# Patient Record
Sex: Male | Born: 1965 | Race: White | Hispanic: No | Marital: Single | Smoking: Never smoker
Health system: Southern US, Community
[De-identification: ages and names within clinical notes are randomized; demographics above are authoritative.]

## PROBLEM LIST (undated history)

## (undated) DIAGNOSIS — B20 Human immunodeficiency virus [HIV] disease: Principal | ICD-10-CM

## (undated) DIAGNOSIS — Z972 Presence of dental prosthetic device (complete) (partial): Secondary | ICD-10-CM

## (undated) DIAGNOSIS — F419 Anxiety disorder, unspecified: Secondary | ICD-10-CM

## (undated) DIAGNOSIS — F329 Major depressive disorder, single episode, unspecified: Secondary | ICD-10-CM

## (undated) DIAGNOSIS — Z973 Presence of spectacles and contact lenses: Secondary | ICD-10-CM

## (undated) DIAGNOSIS — T7840XA Allergy, unspecified, initial encounter: Secondary | ICD-10-CM

## (undated) HISTORY — DX: Presence of spectacles and contact lenses: Z97.3

## (undated) HISTORY — DX: Major depressive disorder, single episode, unspecified: F32.9

## (undated) HISTORY — DX: Anxiety disorder, unspecified: F41.9

## (undated) HISTORY — DX: Presence of dental prosthetic device (complete) (partial): Z97.2

## (undated) HISTORY — DX: Human immunodeficiency virus (HIV) disease: B20

## (undated) HISTORY — DX: Allergy, unspecified, initial encounter: T78.40XA

## (undated) HISTORY — PX: WISDOM TOOTH EXTRACTION: SHX21

---

## 2004-08-22 DIAGNOSIS — B20 Human immunodeficiency virus [HIV] disease: Secondary | ICD-10-CM

## 2004-08-22 HISTORY — DX: Human immunodeficiency virus (HIV) disease: B20

## 2006-08-22 DIAGNOSIS — F32A Depression, unspecified: Secondary | ICD-10-CM

## 2006-08-22 HISTORY — DX: Depression, unspecified: F32.A

## 2013-05-14 ENCOUNTER — Ambulatory Visit (INDEPENDENT_AMBULATORY_CARE_PROVIDER_SITE_OTHER): Payer: BC Managed Care – PPO | Admitting: Medical

## 2013-05-14 ENCOUNTER — Telehealth: Payer: Self-pay | Admitting: Medical

## 2013-05-14 ENCOUNTER — Encounter: Payer: Self-pay | Admitting: Medical

## 2013-05-14 VITALS — BP 110/82 | HR 60 | Temp 98.1°F | Resp 16 | Ht 68.0 in | Wt 254.0 lb

## 2013-05-14 DIAGNOSIS — B20 Human immunodeficiency virus [HIV] disease: Secondary | ICD-10-CM

## 2013-05-14 DIAGNOSIS — Z23 Encounter for immunization: Secondary | ICD-10-CM

## 2013-05-14 DIAGNOSIS — F329 Major depressive disorder, single episode, unspecified: Secondary | ICD-10-CM

## 2013-05-14 DIAGNOSIS — F3289 Other specified depressive episodes: Secondary | ICD-10-CM

## 2013-05-14 DIAGNOSIS — F32A Depression, unspecified: Secondary | ICD-10-CM

## 2013-05-14 MED ORDER — EMTRICITABINE-TENOFOVIR DF 200-300 MG PO TABS: 1.0000 | ORAL_TABLET | Freq: Every day | ORAL | Status: DC

## 2013-05-14 MED ORDER — RALTEGRAVIR POTASSIUM 400 MG PO TABS: 400.0000 mg | ORAL_TABLET | Freq: Two times a day (BID) | ORAL | Status: DC

## 2013-05-14 MED ORDER — FLUOXETINE HCL 20 MG PO TABS: 20.0000 mg | ORAL_TABLET | Freq: Every day | ORAL | Status: DC

## 2013-05-14 NOTE — Telephone Encounter (Signed)
I need help on this one.  Any suggestions?

## 2013-05-14 NOTE — Patient Instructions (Signed)
RESOURCES in [place], [place]  If you are experiencing a mental health crisis or an emergency, please call 911 or go to the nearest emergency department.  [facility]   [phone] [facility]  [phone] [facility]   [phone]  Suicide Hotline 1-800-Suicide (1-800-784-2433)  National Suicide Prevention Lifeline 1-800-273-TALK  (1-800-273-8255)  Domestic Violence, Rape/Crisis - [facility] 336-273-7273  The National Domestic Violence Hotline 1-800-799-SAFE (1-800-799-7233)  To report Child or Elder Abuse, please call: [place] [employer]  [phone] [place] [employer]  [phone]  LGBT Youth Crisis Line 1-866-488-7386  Teen Crisis line 336-387-6161 or 1-877-332-7333     Psychiatry and Counseling services   Dr. Parish McKinney, psychiatry [phone] office [email or web address] [address], [address], [place], [place] [address] Dr. Parish McKinney Meredith Baker, NP Micheal Lefaive, NP  Anxiety, Depression, ADHD, OCD, Eating Disorders, Bipolar, other   [facility] [phone] office [email or web address] [address] [place], [place] [address]  Dr. Masoud Hejazi, psychiatry services  Dr. Todd Lewis  Depression, Anxiety, Substance Abuse, Couples Issues, Adolescent Issues Robyn Bridges, NP Depression, Anxiety, ADHD, Women's issues, Bipolar Disorder, Substance   Abuse Kelly Virgil, NP Depression, Anxiety, Aging, ADHD, Bipolar Disorder, Substance Abuse Ann Bailey, NP  Mood disturbances, ADHD, children, adolescents, adults Deb Connery, Therapist Sexual Addiction, Bipolar, Depression, Anxiety, Substance Addiction Claudia McCoy, Therapist Grief and Loss, Anxiety, Depression, Bipolar, Medical Challenges, Life    Transitions Linda Hileman, Therapist Substance Abuse, Relationships, Clergy Families, Anger and Stress Management, Postpartum Depression,  Pre-Marital Counseling Patricia Goral, Therapist [place], Anxiety, Depression, ADHD, Adjustment Disorder, PTSD, Grief and Loss, Divorce, Adoption Concerns   [facility] [phone] office [email or web address] [address], [place], [place] [address]  Erik Nelson, MA, clinical psychologist  Cognitive-Behavior Therapy; Mood Disorders; Anxiety Disorders; adult and child ADHD; Family Therapy; Stress Management; personal growth, and Marital Therapy.    Dennis McKnight Ph.D., clinical psychologist Cognitive-Behavior Therapy; Mood Disorders; Anxiety Disorders; Stress     Management   Elaine Talbert Ph.D., clinical psychologist [phone] office [address] [place], [place] [address] Cognitive Behavior Therapy, Depression, Bipolar, Anxiety, Grief and Loss    [facility] [phone] office [address] [place], [place] [address] Crisis services, Family support, in home therapy, treatment for Anxiety, PTSD, Sexual Assault, Substance Abuse, Financial/Credit Counseling, Variety of other services    [email or web address] [phone] office [address], [place], [place] [address]  Scott Yount, Ph.D., LPC Family, Couples, Anxiety, Depression, ADHD, Abuse, Anger Management Catharine Dowda, M.Ed., LPC Couples, Sexual orientation, Domestic violence, Child Abuse, Major Life Change,  Depression Traci Collins, LPC Marriage counseling, Women's Issues, Depression, Intimacy, Career Issues Katherine Glenn, Ph.D.,  LPC PTSD, Addictions, Grief, Anxiety, Sexual Orientation Eugene Naughton, LPC Teen and child depression, anxiety, parenting challenges, Adult depression, self injury, relationship issues. Karen Elliott, LPC Addiction, PTSD, Eating Disorders, Depression, Sexual Orientation Sara DeHart Young, LPC Eating  disorder, Anxiety and Depression, Grief, Divorce, Couples and Family Counseling, Parenting   Dr. Gerald Plovsky, psychiatry [phone] office [address] [place], [place] [address]  Geriatric psychiatry services   [facility] [phone] office, 24x7 help line [email or web address] [address] [place],  [address] Substance Abuse, Depression, Anxiety, Mood Disorders, other Addictions, DWI Assessment/Treatment, Teen Issues, ADHD, Family Therapy Dr. Carol Sena, Psychiatry services   Stephen Ringer, Therapist Initial assessments, Clinical Director, Substance Abuse counseling, DWI and DMV assessments, individual and group counseling   Nancy King, Therapist Depression, Anxiety, Dysfunctional families, Individual and Couples Counseling, Addiction, Sexual Abuse, Childhood Trauma, Spiritually Based Counseling Robin Ringer, Therapist [facility], Children and Adult Individual Counseling, Depression, Anxiety, Mood Disorders Valerie Phillips, Therapist Ages 5 and up, individual, couple and family therapy, family concerns, ADHD, Mood disorders, Grief, Substance Abuse Melissa Mekita, Therapist Male patients only - Mood disorders, Depression, Anxiety, PTSD, Gried,   Abuse, Relationships   Dr. Rupinder Kaur, psychiatry [phone] office [address], [place], [place] [address]   

## 2013-05-14 NOTE — Progress Notes (Signed)
Subjective: Here to establish care.  Moved here from [place] in June 2014.  He has a hx/o HIV disease, depression.  Been out of medication 6 weeks.  [place] both primary care provider as well as Infectious Disease specialist.  Last viral load >1 year ago.  He notes the first 2 months after being here in [place] was doing fine until he ran of his medication.  There was an insurance mix up and suddenly he was out of medication insurance coverage, copays were going to be outrageous, thus, the reason for him being out of medication.   HIV diagnosis 2008.  Last viral load>1 year ago, not sure of his last CD4 count or viral load.   Was seeing ID in [place].  Has been on the same regimen since diagnosis.  He has already signed for records to be sent to [place].  Denies prior prophylactic antibiotics, no recent illnesses.  In general has been pretty healthy.  He currently feels very depressed.  When on Prozac, this works well.   He has been out of this 6 weeks.  He was put on Prozac several years ago, and has always done well on this.  He has been hospitalized years ago for depression.  He is not exercising.  He is upset today having to go back over his medical history to new people.  Feels like every body knows his business.  He moved here for work.  His mood was fine the first 2 months he moved here, but once he ran out of medication, mood has been down.    Past Medical History  Diagnosis Date  . Wears contact lenses   . Dental bridge present   . Anxiety   . Depression 2008  . HIV disease 2006   ROS as in subjective  Objective: Filed Vitals:   05/14/13 0824  BP: 110/82  Pulse: 60  Temp: 98.1 F (36.7 C)  Resp: 16    General appearance: alert, no distress, WD/WN Neck: supple, no lymphadenopathy, no thyromegaly, no masses Heart: RRR, normal S1, S2, no murmurs Lungs: CTA bilaterally, no wheezes, rhonchi, or rales Abdomen: +bs, soft, non tender, non distended, no masses, no  hepatomegaly, no splenomegaly Pulses: 2+ symmetric, upper and lower extremities, normal cap refill Ext: no edema Psych: tearful, depressed affect, poor eye contact   Assessment: Encounter Diagnoses  Name Primary?  Marland Kitchen HIV disease Yes  . Depression   . Need for prophylactic vaccination and inoculation against influenza      Plan: HIV disease - out of medication 6weeks, but had been stable for years on current regimen.  I introduced him to my supervising physician Dr. Susann Givens as well.  We will start him back on his regimen, will get his medical records for review, and will have him recheck with Dr. Susann Givens in 32mo, plan for labs at that time.  Depression - get back on Prozac.  Refilled medication, advised 1/2 tablet daily x 1wk, then 1 tablet daily.  Gave list of mental health resources for counseling and hotlines if crisis.  Advised he call/return immediately if needed, otherwise f/u in 32mo.  Counseled on the influenza virus vaccine.  Vaccine information sheet given.  Influenza vaccine given after consent obtained.  Follow-up 32mo with Dr. Susann Givens

## 2013-05-15 ENCOUNTER — Other Ambulatory Visit: Payer: Self-pay

## 2013-05-15 MED ORDER — EMTRICITABINE-TENOFOVIR DF 200-300 MG PO TABS: 1.0000 | ORAL_TABLET | Freq: Every day | ORAL | Status: DC

## 2013-05-15 MED ORDER — RALTEGRAVIR POTASSIUM 400 MG PO TABS: 400.0000 mg | ORAL_TABLET | Freq: Two times a day (BID) | ORAL | Status: DC

## 2013-05-15 NOTE — Telephone Encounter (Signed)
ALSO TALKED WITH [facility]  ASKED HIM TO CALL [employer] TO FIND OUT WHERE HIS MEDS NEED TO BE SENT AND CALL ME BACK JUST WANTED TO LET YOU KNOW

## 2013-05-15 NOTE — Telephone Encounter (Signed)
This doesn't make any sense. Check with the pharmacy and with him concerning this and let me know

## 2013-05-15 NOTE — Telephone Encounter (Signed)
Call Friday to make sure we have his prescription straight.  My understanding is that [facility] sent script to mail order per pharmacy request.

## 2013-05-15 NOTE — Telephone Encounter (Signed)
PT CALLED AND SAID HIS HIV MEDS NEED TO GO TO [facility] AND OR [facility]

## 2013-05-15 NOTE — Telephone Encounter (Signed)
DR.LALONDE CALLED AND TALKED WITH [name] AT [facility] HE SAID [name] HAD TO BE FILLED [facility].

## 2013-05-17 NOTE — Telephone Encounter (Signed)
I called and I spoke with the patient who states that he will contact the mail order pharmacy about his medications. CLS

## 2013-05-21 ENCOUNTER — Telehealth: Payer: Self-pay | Admitting: Internal Medicine

## 2013-05-21 MED ORDER — EMTRICITABINE-TENOFOVIR DF 200-300 MG PO TABS: 1.0000 | ORAL_TABLET | Freq: Every day | ORAL | Status: DC

## 2013-05-21 MED ORDER — RALTEGRAVIR POTASSIUM 400 MG PO TABS: 400.0000 mg | ORAL_TABLET | Freq: Two times a day (BID) | ORAL | Status: DC

## 2013-05-21 NOTE — Telephone Encounter (Signed)
Pt called stating that [facility] never got the rxs,  But it looked like it was sent to a [facility] and not the correct one. i resent [facility] the [facility] and told pt to call alittle later to the pharmacy making sure they had it

## 2013-06-11 ENCOUNTER — Ambulatory Visit (INDEPENDENT_AMBULATORY_CARE_PROVIDER_SITE_OTHER): Payer: BC Managed Care – PPO | Admitting: Family Medicine

## 2013-06-11 ENCOUNTER — Encounter: Payer: Self-pay | Admitting: Family Medicine

## 2013-06-11 VITALS — BP 128/88 | HR 78 | Wt 254.0 lb

## 2013-06-11 DIAGNOSIS — F329 Major depressive disorder, single episode, unspecified: Secondary | ICD-10-CM

## 2013-06-11 DIAGNOSIS — B20 Human immunodeficiency virus [HIV] disease: Secondary | ICD-10-CM

## 2013-06-11 DIAGNOSIS — F32A Depression, unspecified: Secondary | ICD-10-CM

## 2013-06-11 DIAGNOSIS — Z79899 Other long term (current) drug therapy: Secondary | ICD-10-CM

## 2013-06-11 DIAGNOSIS — F3289 Other specified depressive episodes: Secondary | ICD-10-CM

## 2013-06-11 LAB — COMPREHENSIVE METABOLIC PANEL
AST: 19 U/L (ref 0–37)
Albumin: 4.4 g/dL (ref 3.5–5.2)
BUN: 9 mg/dL (ref 6–23)
CO2: 25 mEq/L (ref 19–32)
Creat: 0.66 mg/dL (ref 0.50–1.35)
Glucose, Bld: 102 mg/dL — ABNORMAL HIGH (ref 70–99)
Sodium: 142 mEq/L (ref 135–145)
Total Bilirubin: 1.3 mg/dL — ABNORMAL HIGH (ref 0.3–1.2)
Total Protein: 7.1 g/dL (ref 6.0–8.3)

## 2013-06-11 LAB — CBC WITH DIFFERENTIAL/PLATELET
Basophils Relative: 0 % (ref 0–1)
Eosinophils Absolute: 0.1 10*3/uL (ref 0.0–0.7)
Eosinophils Relative: 2 % (ref 0–5)
HCT: 41.1 % (ref 39.0–52.0)
Hemoglobin: 14.2 g/dL (ref 13.0–17.0)
Lymphs Abs: 1.6 10*3/uL (ref 0.7–4.0)
MCH: 30.7 pg (ref 26.0–34.0)
MCHC: 34.5 g/dL (ref 30.0–36.0)
MCV: 89 fL (ref 78.0–100.0)
Monocytes Absolute: 0.3 10*3/uL (ref 0.1–1.0)
Monocytes Relative: 7 % (ref 3–12)
Neutrophils Relative %: 49 % (ref 43–77)
Platelets: 143 10*3/uL — ABNORMAL LOW (ref 150–400)
RBC: 4.62 MIL/uL (ref 4.22–5.81)

## 2013-06-11 LAB — LIPID PANEL
Cholesterol: 136 mg/dL (ref 0–200)
HDL: 48 mg/dL (ref >39)
Triglycerides: 69 mg/dL (ref <150)
VLDL: 14 mg/dL (ref 0–40)

## 2013-06-11 NOTE — Progress Notes (Signed)
  Subjective:    Patient ID: Patrick Holmes, male    DOB: [birth year], 47 y.o.   MRN: [id number]  HPI He is here for a recheck. He is now back on all of his medications. He states that the Prozac has worked quite well and he is now back to his normal self. He also is on his HIV medications and having no difficulty with them. He was off and on for roughly 6 weeks.   Review of Systems     Objective:   Physical Exam Alert and in no distress with appropriate affect       Assessment & Plan:  HIV disease - Plan: CBC with Differential, Comprehensive metabolic panel, Lipid panel, HIV 1 RNA quant-no reflex-bld, T-helper cells (CD4) count  Depression  Encounter for long-term (current) use of other medications - Plan: CBC with Differential, Comprehensive metabolic panel, Lipid panel, HIV 1 RNA quant-no reflex-bld, T-helper cells (CD4) count  I discussed HIV care with him and my experience taking care of patients. Continue on present medication regimen.

## 2013-06-13 LAB — T-HELPER CELLS (CD4) COUNT (NOT AT ARMC)
Absolute CD4: 447 /uL (ref 381–1469)
CD4 T Helper %: 28 % — ABNORMAL LOW (ref 32–62)
Total Lymphocyte: 42 % (ref 12–46)
WBC, lymph enumeration: 3.8 10*3/uL — ABNORMAL LOW (ref 4.0–10.5)

## 2013-06-17 NOTE — Progress Notes (Signed)
Quick Note:  CALLED PT CELL # LEFT MESSAGE TO CALL ME BACK ______

## 2013-08-30 ENCOUNTER — Encounter: Payer: Self-pay | Admitting: Medical

## 2013-09-25 ENCOUNTER — Other Ambulatory Visit: Payer: Self-pay | Admitting: Family Medicine

## 2013-11-26 ENCOUNTER — Other Ambulatory Visit: Payer: Self-pay | Admitting: Medical

## 2013-11-26 NOTE — Telephone Encounter (Signed)
PATIENT NEEDS TO SCHEDULE AN APPOINTMENT TO RECEIVE ANYMORE REFILLS

## 2013-11-26 NOTE — Telephone Encounter (Signed)
Send refill x 68mo and have him f/u recheck with Dr. Susann GivensLalonde

## 2013-12-25 ENCOUNTER — Other Ambulatory Visit: Payer: Self-pay | Admitting: Family Medicine

## 2013-12-31 ENCOUNTER — Encounter: Payer: Self-pay | Admitting: Family Medicine

## 2013-12-31 ENCOUNTER — Ambulatory Visit (INDEPENDENT_AMBULATORY_CARE_PROVIDER_SITE_OTHER): Payer: BC Managed Care – PPO | Admitting: Family Medicine

## 2013-12-31 VITALS — BP 130/80 | HR 72 | Ht 67.0 in | Wt 254.0 lb

## 2013-12-31 DIAGNOSIS — Z79899 Other long term (current) drug therapy: Secondary | ICD-10-CM

## 2013-12-31 DIAGNOSIS — Z0000 Encounter for general adult medical examination without abnormal findings: Secondary | ICD-10-CM

## 2013-12-31 DIAGNOSIS — E669 Obesity, unspecified: Secondary | ICD-10-CM

## 2013-12-31 DIAGNOSIS — Z23 Encounter for immunization: Secondary | ICD-10-CM

## 2013-12-31 DIAGNOSIS — F329 Major depressive disorder, single episode, unspecified: Secondary | ICD-10-CM

## 2013-12-31 DIAGNOSIS — F3289 Other specified depressive episodes: Secondary | ICD-10-CM

## 2013-12-31 DIAGNOSIS — E291 Testicular hypofunction: Secondary | ICD-10-CM | POA: Insufficient documentation

## 2013-12-31 DIAGNOSIS — F32A Depression, unspecified: Secondary | ICD-10-CM | POA: Insufficient documentation

## 2013-12-31 DIAGNOSIS — B20 Human immunodeficiency virus [HIV] disease: Secondary | ICD-10-CM

## 2013-12-31 LAB — COMPREHENSIVE METABOLIC PANEL
ALT: 41 U/L (ref 0–53)
AST: 21 U/L (ref 0–37)
Albumin: 3.9 g/dL (ref 3.5–5.2)
Alkaline Phosphatase: 92 U/L (ref 39–117)
BILIRUBIN TOTAL: 1.5 mg/dL — AB (ref 0.2–1.2)
BUN: 8 mg/dL (ref 6–23)
CALCIUM: 9.1 mg/dL (ref 8.4–10.5)
CHLORIDE: 108 meq/L (ref 96–112)
CO2: 26 mEq/L (ref 19–32)
CREATININE: 0.74 mg/dL (ref 0.50–1.35)
Glucose, Bld: 87 mg/dL (ref 70–99)
Potassium: 4 mEq/L (ref 3.5–5.3)
Sodium: 141 mEq/L (ref 135–145)
Total Protein: 6.4 g/dL (ref 6.0–8.3)

## 2013-12-31 LAB — HEMOCCULT GUIAC POC 1CARD (OFFICE)

## 2013-12-31 LAB — CBC WITH DIFFERENTIAL/PLATELET
BASOS ABS: 0 10*3/uL (ref 0.0–0.1)
Basophils Relative: 0 % (ref 0–1)
EOS ABS: 0.1 10*3/uL (ref 0.0–0.7)
EOS PCT: 2 % (ref 0–5)
HEMATOCRIT: 41.4 % (ref 39.0–52.0)
Hemoglobin: 14.2 g/dL (ref 13.0–17.0)
Lymphocytes Relative: 46 % (ref 12–46)
Lymphs Abs: 1.5 10*3/uL (ref 0.7–4.0)
MCH: 30.6 pg (ref 26.0–34.0)
MCHC: 34.3 g/dL (ref 30.0–36.0)
MCV: 89.2 fL (ref 78.0–100.0)
Monocytes Absolute: 0.3 10*3/uL (ref 0.1–1.0)
Monocytes Relative: 8 % (ref 3–12)
Neutro Abs: 1.4 10*3/uL — ABNORMAL LOW (ref 1.7–7.7)
Neutrophils Relative %: 44 % (ref 43–77)
PLATELETS: 141 10*3/uL — AB (ref 150–400)
RBC: 4.64 MIL/uL (ref 4.22–5.81)
RDW: 14.7 % (ref 11.5–15.5)
WBC: 3.2 10*3/uL — ABNORMAL LOW (ref 4.0–10.5)

## 2013-12-31 LAB — LIPID PANEL
CHOLESTEROL: 147 mg/dL (ref 0–200)
HDL: 49 mg/dL (ref >39)
LDL Cholesterol: 77 mg/dL (ref 0–99)
TRIGLYCERIDES: 105 mg/dL (ref <150)
Total CHOL/HDL Ratio: 3 Ratio
VLDL: 21 mg/dL (ref 0–40)

## 2013-12-31 MED ORDER — FLUOXETINE HCL 20 MG PO CAPS: ORAL_CAPSULE | ORAL | Status: DC

## 2013-12-31 MED ORDER — RALTEGRAVIR POTASSIUM 400 MG PO TABS: 400.0000 mg | ORAL_TABLET | Freq: Two times a day (BID) | ORAL | Status: DC

## 2013-12-31 MED ORDER — EMTRICITABINE-TENOFOVIR DF 200-300 MG PO TABS: ORAL_TABLET | ORAL | Status: DC

## 2013-12-31 NOTE — Progress Notes (Signed)
Subjective:    Patient ID: Patrick Holmes, male    DOB: Apr 07, 1966, 48 y.o.   MRN: [id number]  HPI He is here for complete examination. He continues on medications listed in the chart. He does have a six-year history of depression and on at least 3 occasions has tried to come off of it all with no success. His mood swings depression symptoms recurred. He is quite stable on his HIV medications and is having no difficulty with them. He is started exercising in the last several months but is seen no change in his weight however he has noted more energy and stamina. He also has a remote history of hypogonadism and given topical medications. He however stopped this due to not liking to apply the medication to his skin. He has noted genital atrophy. He also has not had any sexual activity in several years. His home relationship is going well. He is married. Work is going well. Family and social history were reviewed. Review of Systems  All other systems reviewed and are negative.      Objective:   Physical Exam BP 130/80  Pulse 72  Ht 5\' 7"  (1.702 m)  Wt 254 lb (115.214 kg)  BMI 39.77 kg/m2  General Appearance:    Alert, cooperative, no distress, appears stated age  Head:    Normocephalic, without obvious abnormality, atraumatic  Eyes:    PERRL, conjunctiva/corneas clear, EOM's intact, fundi    benign  Ears:    Normal TM's and external ear canals  Nose:   Nares normal, mucosa normal, no drainage or sinus   tenderness  Throat:   Lips, mucosa, and tongue normal; teeth and gums normal  Neck:   Supple, no lymphadenopathy;  thyroid:  no   enlargement/tenderness/nodules; no carotid   bruit or JVD  Back:    Spine nontender, no curvature, ROM normal, no CVA     tenderness  Lungs:     Clear to auscultation bilaterally without wheezes, rales or     ronchi;respirations unlabored  Chest Wall:    No tenderness or deformity   Heart:    Regular rate and rhythm, S1 and S2 normal, no murmur, rub   or gallop    Breast Exam:    No chest wall tenderness, masses or gynecomastia  Abdomen:     Soft, non-tender, nondistended, normoactive bowel sounds,    no masses, no hepatosplenomegaly  Genitalia:    Normal male external genitalia without lesions.  Testicles without masses and slightly atrophied, right greater than left.   No inguinal hernias.  Rectal:    Normal sphincter tone, no masses or tenderness; guaiac negative stool.  Prostate smooth, no nodules, not enlarged.  Extremities:   No clubbing, cyanosis or edema  Pulses:   2+ and symmetric all extremities  Skin:   Skin color, texture, turgor normal, no rashes or lesions  Lymph nodes:   Cervical, supraclavicular, and axillary nodes normal  Neurologic:   CNII-XII intact, normal strength, sensation and gait; reflexes 2+ and symmetric throughout          Psych:   Normal mood, affect, hygiene and grooming.          Assessment & Plan:  Routine general medical examination at a health care facility - Plan: CBC with Differential, Comprehensive metabolic panel, Lipid panel, POCT occult blood stool  Depression - Plan: FLUoxetine (PROZAC) 20 MG capsule  Human immunodeficiency virus (HIV) disease - Plan: raltegravir (ISENTRESS) 400 MG tablet, HIV 1 RNA quant-no  reflex-bld, T-helper cells (CD4) count  Obesity (BMI 30-39.9)  Hypogonadism male - Plan: Testosterone  Need for prophylactic vaccination against Streptococcus pneumoniae (pneumococcus) - Plan: Pneumococcal conjugate vaccine 13-valent  Need for prophylactic vaccination and inoculation against other viral diseases(V04.89) - Plan: Tdap vaccine greater than or equal to 7yo IM  Encounter for long-term (current) use of other medications - Plan: CBC with Differential, Comprehensive metabolic panel, Lipid panel Explained that I have no plans on stopping his Prozac. He is to continue on his other medications. I will check a testosterone level and discuss this with him later. His immunizations were  updated.

## 2014-01-01 LAB — T-HELPER CELLS (CD4) COUNT (NOT AT ARMC)
Absolute CD4: 515 /uL (ref 381–1469)
CD4 T HELPER %: 35 % (ref 32–62)
TOTAL LYMPHOCYTE COUNT: 1472 /uL (ref 700–3300)
Total Lymphocyte: 46 % (ref 12–46)
WBC, lymph enumeration: 3.2 10*3/uL — ABNORMAL LOW (ref 4.0–10.5)

## 2014-01-01 LAB — HIV-1 RNA QUANT-NO REFLEX-BLD
HIV 1 RNA QUANT: 62 {copies}/mL — AB (ref <20)
HIV-1 RNA Quant, Log: 1.79 {Log} — ABNORMAL HIGH (ref <1.30)

## 2014-01-01 LAB — TESTOSTERONE: TESTOSTERONE: 63 ng/dL — AB (ref 300–890)

## 2014-01-03 ENCOUNTER — Encounter: Payer: Self-pay | Admitting: Family Medicine

## 2014-01-03 ENCOUNTER — Ambulatory Visit (INDEPENDENT_AMBULATORY_CARE_PROVIDER_SITE_OTHER): Payer: BC Managed Care – PPO | Admitting: Family Medicine

## 2014-01-03 VITALS — BP 130/78 | HR 78 | Resp 16 | Ht 67.0 in | Wt 255.0 lb

## 2014-01-03 DIAGNOSIS — E291 Testicular hypofunction: Secondary | ICD-10-CM

## 2014-01-03 DIAGNOSIS — Z125 Encounter for screening for malignant neoplasm of prostate: Secondary | ICD-10-CM

## 2014-01-03 LAB — TSH: TSH: 1.888 u[IU]/mL (ref 0.350–4.500)

## 2014-01-03 MED ORDER — TESTOSTERONE 20.25 MG/ACT (1.62%) TD GEL: 2.0000 "application " | TRANSDERMAL | Status: DC

## 2014-01-03 NOTE — Progress Notes (Signed)
   Subjective:    Patient ID: Patrick Holmes, male    DOB: 11-14-1965, 48 y.o.   MRN: [id number]  HPI He is here for recheck. Recent blood work did indeed show a low testosterone. He is a previous history of hypogonadism however he stopped taking the medication. He states that blood work was taken he also remembers having an MRI done on his head but unsure as exactly why. That information is not in his chart.   Review of Systems     Objective:   Physical Exam Alert and in no distress. Testosterone 63       Assessment & Plan:  Hypogonadism male - Plan: Testosterone 20.25 MG/ACT (1.62%) GEL, Prolactin, TSH  Special screening for malignant neoplasm of prostate - Plan: PSA  I will get information from his other physician. I will also check his pituitary with a TSH and prolactin. Instructed him on proper use of AndroGel. Recheck here in roughly one month. He will also sign a release form.

## 2014-01-04 LAB — PSA: PSA: 0.02 ng/mL (ref <=4.00)

## 2014-01-04 LAB — PROLACTIN: PROLACTIN: 9.3 ng/mL (ref 2.1–17.1)

## 2014-01-07 ENCOUNTER — Telehealth: Payer: Self-pay | Admitting: Family Medicine

## 2014-01-07 NOTE — Telephone Encounter (Deleted)
Initiated P.A. Androgel 

## 2014-01-08 NOTE — Telephone Encounter (Signed)
P.A. ANDROGEL approved til 08/21/38, faxed pharmacy, pt informed

## 2014-01-17 ENCOUNTER — Other Ambulatory Visit: Payer: Self-pay | Admitting: Family Medicine

## 2014-02-04 ENCOUNTER — Encounter: Payer: Self-pay | Admitting: Family Medicine

## 2014-02-04 ENCOUNTER — Ambulatory Visit (INDEPENDENT_AMBULATORY_CARE_PROVIDER_SITE_OTHER): Payer: BC Managed Care – PPO | Admitting: Family Medicine

## 2014-02-04 VITALS — BP 120/82 | HR 78 | Temp 98.4°F | Resp 16 | Wt 258.0 lb

## 2014-02-04 DIAGNOSIS — E291 Testicular hypofunction: Secondary | ICD-10-CM

## 2014-02-04 DIAGNOSIS — Z79899 Other long term (current) drug therapy: Secondary | ICD-10-CM

## 2014-02-04 NOTE — Progress Notes (Signed)
   Subjective:    Patient ID: Patrick Holmes, male    DOB: 05/20/1966, 48 y.o.   MRN: [id number]  HPI He is here for recheck on testosterone. He has noted some hair growth,  energy and stamina. He is also noted an increase in his libido.   Review of Systems     Objective:   Physical Exam Alert and in no distress otherwise not examined       Assessment & Plan:  Hypogonadism male - Plan: Testosterone  Encounter for long-term (current) use of other medications - Plan: Testosterone

## 2014-02-05 LAB — TESTOSTERONE: Testosterone: 289 ng/dL — ABNORMAL LOW (ref 300–890)

## 2014-05-13 ENCOUNTER — Encounter: Payer: Self-pay | Admitting: Family Medicine

## 2014-05-13 ENCOUNTER — Ambulatory Visit (INDEPENDENT_AMBULATORY_CARE_PROVIDER_SITE_OTHER): Payer: BC Managed Care – PPO | Admitting: Family Medicine

## 2014-05-13 VITALS — BP 130/90 | HR 68 | Wt 260.0 lb

## 2014-05-13 DIAGNOSIS — H00013 Hordeolum externum right eye, unspecified eyelid: Secondary | ICD-10-CM

## 2014-05-13 DIAGNOSIS — H00019 Hordeolum externum unspecified eye, unspecified eyelid: Secondary | ICD-10-CM

## 2014-05-13 DIAGNOSIS — Z23 Encounter for immunization: Secondary | ICD-10-CM

## 2014-05-13 MED ORDER — ERYTHROMYCIN 5 MG/GM OP OINT: 1.0000 "application " | TOPICAL_OINTMENT | Freq: Three times a day (TID) | OPHTHALMIC | Status: DC

## 2014-05-13 NOTE — Progress Notes (Signed)
   Subjective:    Patient ID: Patrick Holmes, male    DOB: 11/16/1965, 48 y.o.   MRN: [id number]  HPI He has had difficulty over the last 10 days with crusting and drainage from the right eye. Several days ago he did note some swelling and did use heat on it. He states that the day the swelling did diminish.   Review of Systems     Objective:   Physical Exam Right lower medial eyelid is slightly erythematous and tender to palpation. Cornea and conjunctiva are normal. TMs normal.       Assessment & Plan:  Stye external, right - Plan: erythromycin ophthalmic ointment  Need for prophylactic vaccination and inoculation against influenza - Plan: Flu Vaccine QUAD 36+ mos IM  is recommended continue with moist heat.

## 2014-05-24 ENCOUNTER — Other Ambulatory Visit: Payer: Self-pay | Admitting: Family Medicine

## 2014-05-26 NOTE — Telephone Encounter (Signed)
Called in med to pharmacy and pt has set up an appt for October 20th

## 2014-05-26 NOTE — Telephone Encounter (Signed)
Is this okay to call in? 

## 2014-05-26 NOTE — Telephone Encounter (Signed)
Have him come in for a visit and testosterone but do not let him run out of his testosterone

## 2014-05-26 NOTE — Telephone Encounter (Signed)
Called and left a message for pt to call back and let us know if pt needs a refill on this and to set up an appt to see Dr. Susann GivensLalonde

## 2014-05-27 ENCOUNTER — Telehealth: Payer: Self-pay | Admitting: Internal Medicine

## 2014-05-27 DIAGNOSIS — B20 Human immunodeficiency virus [HIV] disease: Secondary | ICD-10-CM

## 2014-05-27 MED ORDER — EMTRICITABINE-TENOFOVIR DF 200-300 MG PO TABS: ORAL_TABLET | ORAL | Status: DC

## 2014-05-27 MED ORDER — RALTEGRAVIR POTASSIUM 400 MG PO TABS: 400.0000 mg | ORAL_TABLET | Freq: Two times a day (BID) | ORAL | Status: DC

## 2014-05-27 NOTE — Telephone Encounter (Signed)
Refill request for isentress 40mg  #90 to [facility]

## 2014-05-27 NOTE — Telephone Encounter (Signed)
Pt does have an appt coming up on 10/20

## 2014-06-10 ENCOUNTER — Encounter: Payer: Self-pay | Admitting: Family Medicine

## 2014-06-10 ENCOUNTER — Ambulatory Visit (INDEPENDENT_AMBULATORY_CARE_PROVIDER_SITE_OTHER): Payer: BC Managed Care – PPO | Admitting: Family Medicine

## 2014-06-10 VITALS — BP 140/90 | HR 80 | Wt 260.0 lb

## 2014-06-10 DIAGNOSIS — E291 Testicular hypofunction: Secondary | ICD-10-CM

## 2014-06-10 DIAGNOSIS — F32A Depression, unspecified: Secondary | ICD-10-CM

## 2014-06-10 DIAGNOSIS — E669 Obesity, unspecified: Secondary | ICD-10-CM

## 2014-06-10 DIAGNOSIS — B20 Human immunodeficiency virus [HIV] disease: Secondary | ICD-10-CM

## 2014-06-10 DIAGNOSIS — F329 Major depressive disorder, single episode, unspecified: Secondary | ICD-10-CM

## 2014-06-10 NOTE — Progress Notes (Signed)
   Subjective:    Patient ID: Patrick Holmes, male    DOB: 1966/04/22, 48 y.o.   MRN: [id number]  HPI Holmes is here for a recheck. Holmes continues on his testosterone and has noted an improvement in his energy, stamina and libido. Holmes continues on his HIV medications. Holmes has had no weight change, fever, chills, abdominal symptoms. His work and home life are going quite well. Holmes has no other concerns or complaints.   Review of Systems     Objective:   Physical Exam   alert and in no distress. Tympanic membranes and canals are normal. Throat is clear. Tonsils are normal. Neck is supple without adenopathy or thyromegaly. Cardiac exam shows a regular sinus rhythm without murmurs or gallops. Lungs are clear to auscultation. Abdominal exam shows no masses or tenderness. No inguinal or axillary adenopathy noted.  Assessment & Plan:  Depression  Human immunodeficiency virus (HIV) disease - Plan: CBC with Differential, Comprehensive metabolic panel, Lipid panel, HIV 1 RNA quant-no reflex-bld, T-helper cells (CD4) count  Obesity (BMI 30-39.9) - Plan: CBC with Differential, Comprehensive metabolic panel, Lipid panel  Hypogonadism male - Plan: Testosterone

## 2014-06-11 LAB — CBC WITH DIFFERENTIAL/PLATELET
BASOS PCT: 0 % (ref 0–1)
Basophils Absolute: 0 10*3/uL (ref 0.0–0.1)
EOS ABS: 0.1 10*3/uL (ref 0.0–0.7)
EOS PCT: 2 % (ref 0–5)
HCT: 41.2 % (ref 39.0–52.0)
Hemoglobin: 14.5 g/dL (ref 13.0–17.0)
LYMPHS PCT: 45 % (ref 12–46)
Lymphs Abs: 1.7 10*3/uL (ref 0.7–4.0)
MCH: 31.7 pg (ref 26.0–34.0)
MCHC: 35.2 g/dL (ref 30.0–36.0)
MCV: 90 fL (ref 78.0–100.0)
Monocytes Absolute: 0.2 10*3/uL (ref 0.1–1.0)
Monocytes Relative: 5 % (ref 3–12)
Neutro Abs: 1.8 10*3/uL (ref 1.7–7.7)
Neutrophils Relative %: 48 % (ref 43–77)
PLATELETS: 161 10*3/uL (ref 150–400)
RBC: 4.58 MIL/uL (ref 4.22–5.81)
RDW: 15.6 % — ABNORMAL HIGH (ref 11.5–15.5)
WBC: 3.8 10*3/uL — AB (ref 4.0–10.5)

## 2014-06-11 LAB — COMPREHENSIVE METABOLIC PANEL
ALT: 33 U/L (ref 0–53)
AST: 19 U/L (ref 0–37)
Albumin: 3.9 g/dL (ref 3.5–5.2)
Alkaline Phosphatase: 72 U/L (ref 39–117)
BILIRUBIN TOTAL: 1.2 mg/dL (ref 0.2–1.2)
BUN: 10 mg/dL (ref 6–23)
CO2: 26 mEq/L (ref 19–32)
CREATININE: 0.7 mg/dL (ref 0.50–1.35)
Calcium: 9.1 mg/dL (ref 8.4–10.5)
Chloride: 110 mEq/L (ref 96–112)
Glucose, Bld: 99 mg/dL (ref 70–99)
Potassium: 3.9 mEq/L (ref 3.5–5.3)
Sodium: 142 mEq/L (ref 135–145)
Total Protein: 6.3 g/dL (ref 6.0–8.3)

## 2014-06-11 LAB — LIPID PANEL
CHOL/HDL RATIO: 3 ratio
Cholesterol: 127 mg/dL (ref 0–200)
HDL: 42 mg/dL (ref >39)
LDL Cholesterol: 67 mg/dL (ref 0–99)
Triglycerides: 89 mg/dL (ref <150)
VLDL: 18 mg/dL (ref 0–40)

## 2014-06-11 LAB — HIV-1 RNA QUANT-NO REFLEX-BLD

## 2014-06-11 LAB — T-HELPER CELLS (CD4) COUNT (NOT AT ARMC)
Absolute CD4: 599 /uL (ref 381–1469)
CD4 T Helper %: 35 % (ref 32–62)
TOTAL LYMPHOCYTE COUNT: 1710 /uL (ref 700–3300)
Total Lymphocyte: 45 % (ref 12–46)
WBC, lymph enumeration: 3.8 10*3/uL — ABNORMAL LOW (ref 4.0–10.5)

## 2014-06-11 LAB — TESTOSTERONE: Testosterone: 215 ng/dL — ABNORMAL LOW (ref 300–890)

## 2014-06-12 ENCOUNTER — Other Ambulatory Visit: Payer: Self-pay

## 2014-06-12 DIAGNOSIS — R7989 Other specified abnormal findings of blood chemistry: Secondary | ICD-10-CM

## 2014-06-12 MED ORDER — TESTOSTERONE 20.25 MG/ACT (1.62%) TD GEL: 4.0000 "application " | Freq: Every day | TRANSDERMAL | Status: DC

## 2014-06-25 ENCOUNTER — Other Ambulatory Visit: Payer: Self-pay | Admitting: Family Medicine

## 2014-07-11 ENCOUNTER — Other Ambulatory Visit: Payer: Self-pay

## 2014-07-25 ENCOUNTER — Other Ambulatory Visit: Payer: Self-pay | Admitting: Family Medicine

## 2014-08-27 ENCOUNTER — Other Ambulatory Visit: Payer: Self-pay | Admitting: Family Medicine

## 2014-08-27 DIAGNOSIS — E291 Testicular hypofunction: Secondary | ICD-10-CM

## 2014-08-28 NOTE — Telephone Encounter (Signed)
Is this okay to refill? 

## 2014-08-28 NOTE — Telephone Encounter (Signed)
Renew the medication and have him come in for testosterone blood draw

## 2014-08-28 NOTE — Telephone Encounter (Signed)
Called in med to pharmacy and pt is coming in Tuesday January 12th for lab draw

## 2014-09-02 ENCOUNTER — Other Ambulatory Visit: Payer: Self-pay

## 2014-09-29 ENCOUNTER — Encounter: Payer: Self-pay | Admitting: Family Medicine

## 2014-09-29 ENCOUNTER — Ambulatory Visit (INDEPENDENT_AMBULATORY_CARE_PROVIDER_SITE_OTHER): Payer: BLUE CROSS/BLUE SHIELD | Admitting: Family Medicine

## 2014-09-29 VITALS — BP 140/80 | HR 72 | Temp 98.3°F | Ht 68.0 in | Wt 253.0 lb

## 2014-09-29 DIAGNOSIS — J069 Acute upper respiratory infection, unspecified: Secondary | ICD-10-CM

## 2014-09-29 NOTE — Patient Instructions (Signed)
  Drink plenty of fluids. Use guaifenesin (expectorant found in Mucinex, Robitussin and others) to help keep the mucus and phlegm thin.  Consider using combination with dextromethorphan (DM versions) which is a cough suppressant, vs getting a separate DM medication such as Delsym syrup, to use just if needed for cough. Consider using decongestant such as sudafed to help with sinus congestion, runny nose, sinus pressure.  It is okay to use allegra, if needed.  Call in the next 2-5 days if your symptoms progressively worsen, rather than improve--ie fevers, thickened and more discolored mucus or phlegm, worsening cough (with ongoing discolored mucus).  Return if shortness of breath develops, pain with breathing, high fevers, or other concerns.

## 2014-09-29 NOTE — Progress Notes (Signed)
Chief Complaint  Patient presents with  . Nasal Congestion    started with a sore neck. A day later just felt bad. Has a lot of congestion, mucus is yellow in color. A few people in his office have bronchitis.    One week ago he woke up and couldn't turn his head to the right.  The following day he had headache, congestion, sore throat. He feels worse in the morning, better later in the day, but then later in the day he starts to feel badly again. He thought it was allergies, so started taking Allegra, but hasn't been getting better. He took one day off to rest, drink soup,  He felt better that night, but worse again the next day, coughing and sneezing a lot while on work (3 days ago). +sick contacts at work.  He denies fevers.  Nasal drainage is yellow, sometimes clear. Cough is also productive of yellow phlegm.  Denies shortness of breath.  Yesterday felt a little tight in his chest/throat, not today.  He has taken Allegra, and advil or tylenol occasionally.  Hasn't used anything for cough.  Past Medical History  Diagnosis Date  . Wears contact lenses   . Dental bridge present   . Anxiety   . Depression 2008  . HIV disease 2006   Past Surgical History  Procedure Laterality Date  . Wisdom tooth extraction     History   Social History  . Marital Status: Single    Spouse Name: N/A    Number of Children: N/A  . Years of Education: N/A   Occupational History  . Not on file.   Social History Main Topics  . Smoking status: Never Smoker   . Smokeless tobacco: Never Used  . Alcohol Use: No  . Drug Use: No  . Sexual Activity: Not Currently   Other Topics Concern  . Not on file   Social History Narrative   Works in [employer] in [place] ([employer])   Outpatient Encounter Prescriptions as of 09/29/2014  Medication Sig  . ANDROGEL PUMP 20.25 MG/ACT (1.62%) GEL USE 4 PUMPS ONCE DAILY  . fexofenadine (ALLEGRA) 180 MG tablet Take 180 mg by mouth daily.  Marland Kitchen FLUoxetine (PROZAC) 20  MG capsule take 1 capsule by mouth once daily  . ISENTRESS 400 MG tablet TAKE 1 TABLET BY MOUTH    TWICE A DAY  . TRUVADA 200-300 MG per tablet TAKE 1 TABLET BY MOUTH    DAILY  . [DISCONTINUED] emtricitabine-tenofovir (TRUVADA) 200-300 MG per tablet TAKE 1 TABLET BY MOUTH    DAILY  . [DISCONTINUED] raltegravir (ISENTRESS) 400 MG tablet Take 1 tablet (400 mg total) by mouth 2 (two) times daily.   No Known Allergies  ROS:  Denies chest pain, palpitations, nausea, diarrhea.  He had an episode of post-tussive emesis yesterday.  Denies urinary complaints, bleeding/bruising, skin rashes. See HPI.  PHYSICAL EXAM: BP 140/80 mmHg  Pulse 72  Temp(Src) 98.3 F (36.8 C)  Ht  (1.727 m)  Wt 253 lb (114.76 kg)  BMI 38.48 kg/m2  Well developed, pleasant male, sucking on cough lozenge, with only rare cough. HEENT: PERRL, EOMI, conjunctiva clear.  TM's and EAC's normal. Nasal mucosa is moderately edematous, no erythema or purulence.  Clear mucus noted. OP is clear.  Moist mucus membranes without lesions. Sinuses nontender Neck; no lymphadenopathy noted, nontender, no muscle spasm, FROM Heart: regular rate and rhythm, no murmur Lungs: clear bilaterally with good air movement Psych: normal mood, affect, hygiene  and grooming Neuro: alert and oriented.  Cranial nerves intact, normal gait, strength  ASSESSMENT/PLAN:  Acute upper respiratory infection   Drink plenty of fluids. Use guaifenesin (expectorant found in Mucinex, Robitussin and others) to help keep the mucus and phlegm thin.  Consider using combination with dextromethorphan (DM versions) which is a cough suppressant, vs getting a separate DM medication such as Delsym syrup, to use just if needed for cough. Consider using decongestant such as sudafed to help with sinus congestion, runny nose, sinus pressure.  It is okay to use allegra, if needed.  Call in the next 2-5 days if your symptoms progressively worsen, rather than improve--ie  fevers, thickened and more discolored mucus or phlegm, worsening cough (with ongoing discolored mucus).  Return if shortness of breath develops, pain with breathing, high fevers, or other concerns.   Discussed Tessalon--to call if needed.

## 2014-10-08 ENCOUNTER — Other Ambulatory Visit: Payer: Self-pay | Admitting: Family Medicine

## 2014-10-08 NOTE — Telephone Encounter (Signed)
Is this okay to refill? 

## 2014-10-27 ENCOUNTER — Other Ambulatory Visit: Payer: Self-pay | Admitting: Family Medicine

## 2014-10-27 NOTE — Telephone Encounter (Signed)
Is this okay to refill? 

## 2014-11-11 ENCOUNTER — Telehealth: Payer: Self-pay | Admitting: Family Medicine

## 2014-11-11 NOTE — Telephone Encounter (Signed)
Call this in 

## 2014-11-11 NOTE — Telephone Encounter (Signed)
Pt called and scheduled his cpe for May. He needs refills on Androgel sent to pharmacy until visit. Pt uses [facility] [address].

## 2014-11-12 ENCOUNTER — Other Ambulatory Visit: Payer: Self-pay

## 2014-11-12 MED ORDER — TESTOSTERONE 20.25 MG/ACT (1.62%) TD GEL: TRANSDERMAL | Status: DC

## 2014-11-12 NOTE — Telephone Encounter (Signed)
Left message to call back he needs an appointment

## 2014-11-12 NOTE — Telephone Encounter (Signed)
done

## 2014-11-12 NOTE — Telephone Encounter (Signed)
Pt called back and asked to speak to you. I read your note and looks like you needed him to schedule an appt. He already has one for may.

## 2014-11-23 ENCOUNTER — Emergency Department (INDEPENDENT_AMBULATORY_CARE_PROVIDER_SITE_OTHER): Payer: BLUE CROSS/BLUE SHIELD

## 2014-11-23 ENCOUNTER — Emergency Department (INDEPENDENT_AMBULATORY_CARE_PROVIDER_SITE_OTHER)
Admission: EM | Admit: 2014-11-23 | Discharge: 2014-11-23 | Disposition: A | Discharge status: Home / Self Care | Payer: BLUE CROSS/BLUE SHIELD | Source: Home / Self Care | Attending: Family Medicine | Admitting: Family Medicine

## 2014-11-23 ENCOUNTER — Encounter (HOSPITAL_COMMUNITY): Payer: Self-pay | Admitting: Emergency Medicine

## 2014-11-23 DIAGNOSIS — S52132A Displaced fracture of neck of left radius, initial encounter for closed fracture: Secondary | ICD-10-CM

## 2014-11-23 DIAGNOSIS — W19XXXA Unspecified fall, initial encounter: Secondary | ICD-10-CM

## 2014-11-23 MED ORDER — HYDROCODONE-ACETAMINOPHEN 5-325 MG PO TABS: 1.0000 | ORAL_TABLET | ORAL | Status: DC | PRN

## 2014-11-23 MED ORDER — HYDROCODONE-ACETAMINOPHEN 5-325 MG PO TABS
1.0000 | ORAL_TABLET | Freq: Once | ORAL | Status: AC
  Administered 2014-11-23: 1 via ORAL

## 2014-11-23 MED ORDER — HYDROCODONE-ACETAMINOPHEN 5-325 MG PO TABS
ORAL_TABLET | ORAL | Status: AC
  Filled 2014-11-23: qty 1

## 2014-11-23 NOTE — Discharge Instructions (Signed)
Elbow Fracture, Simple A fracture is a break in one of the bones.When fractures are not displaced or separated, they may be treated with only a sling or splint. The sling or splint may only be required for two to three weeks. In these cases, often the elbow is put through early range of motion exercises to prevent the elbow from getting stiff. DIAGNOSIS  The diagnosis (learning what is wrong) of a fractured elbow is made by x-ray. These may be required before and after the elbow is put into a splint or cast. X-rays are taken after to make sure the bone pieces have not moved. HOME CARE INSTRUCTIONS   Only take over-the-counter or prescription medicines for pain, discomfort, or fever as directed by your caregiver.  If you have a splint held on with an elastic wrap and your hand or fingers become numb or cold and blue, loosen the wrap and reapply more loosely. See your caregiver if there is no relief.  You may use ice for twenty minutes, four times per day, for the first two to three days.  Use your elbow as directed.  See your caregiver as directed. It is very important to keep all follow-up referrals and appointments in order to avoid any long-term problems with your elbow including chronic pain or stiffness. SEEK IMMEDIATE MEDICAL CARE IF:   There is swelling or increasing pain in elbow.  You begin to lose feeling or experience numbness or tingling in your hand or fingers.  You develop swelling of the hand and fingers.  You get a cold or blue hand or fingers on affected side. MAKE SURE YOU:   Understand these instructions.  Will watch your condition.  Will get help right away if you are not doing well or get worse. Document Released: 08/02/2001 Document Revised: 10/31/2011 Document Reviewed: 06/23/2009 [facility] Patient Information 2015 [place], [place]. This information is not intended to replace advice given to you by your health care provider. Make sure you discuss any questions you  have with your health care provider.  Radius Fracture with Rehab A radius fracture is a partial or complete break (fracture) in the thumb side forearm bone (radius). This document does not discuss radius fractures that include the elbow or wrist. SYMPTOMS   Severe pain over the site of fracture immediately after injury.  Tenderness, inflammation and/or bruising (contusion) over the forearm.  Contusion usually occurs within 48 hours.  If the fractured bone fragments are out of alignment (displaced), then a visible deformity may be present.  Signs of nerve or vascular damage, such as numbness, coldness or paralysis below the fracture site. CAUSES A fracture occurs when a force is placed on the bone that is greater than it can withstand. Common mechanisms of injury include:  Direct trauma to the forearm.  Indirect stress on the bone, such as falling on an outstretched hand. RISK INCREASES WITH:  Contact sports (football, rugby, soccer, martial arts, lacrosse or hockey).  Activities in which falling is likely to occur.  Bone disease (osteoporosis or bone tumor).  Previous arm injury or immobilization.  Poor strength and flexibility. PREVENTION  Warm up and stretch properly before activity.  Maintain physical fitness:  Strength, flexibility and endurance.  Cardiovascular fitness.  Learn and use proper technique, especially when falling. When possible, have a coach correct improper technique.  Wear properly fitted and padded protective equipment. PROGNOSIS  If treated properly, then radius fractures typically heal in 6 to 8 weeks in adults and 4 to 6  weeks in children. Occasionally surgery is necessary to realign the fracture. RELATED COMPLICATIONS  Failure of the fracture to heal (nonunion).  Healing of the fracture in a poor position (malunion).  Chronic pain, stiffness, loss of motion or swelling of the elbow or wrist.  Bleeding within the forearm that causes  pressure to be placed on the nerves and vessels (compartment syndrome).  Heterotopic calcification of the soft tissues about the forearm (ossification).  Injury to the nerves of the hand or wrist due to stretching from the fracture, causing numbness, weakness or paralysis.  Shortening of the arm.  Loss of motion in the elbow, forearm or wrist. TREATMENT Treatment initially involves the use of ice and medication to help reduce pain and inflammation. If the fractured bone fragments displaced, then the fracture must be realigned (reduced) immediately by a person trained in the procedure. Once the fracture is properly aligned, then forearm, elbow and wrist must be immobilized for a period to allow for healing of the bone. After immobilization, it is important to perform strengthening and stretching exercises to help regain strength and a full range of motion. These exercises may be completed at home or with a therapist. MEDICATION   If pain medication is necessary, then nonsteroidal anti-inflammatory medications, such as aspirin and ibuprofen, or other minor pain relievers, such as acetaminophen, are often recommended.  Do not take pain medication for 7 days before surgery.  Prescription pain relievers may be given if deemed necessary by your caregiver. Use only as directed and only as much as you need. COLD THERAPY  Cold treatment (icing) relieves pain and reduces inflammation. Cold treatment should be applied for 10 to 15 minutes every 2 to 3 hours for inflammation and pain and immediately after any activity that aggravates your symptoms. Use ice packs or massage the area with a piece of ice (ice massage). SEEK MEDICAL CARE IF:   Treatment seems to offer no benefit or the condition worsens.  Any medications produce adverse side effects.  Any complications from surgery occur:  Pain, numbness or coldness in the extremity operated upon.  Discoloration of the nail beds (they become blue or  gray) of the extremity operated upon.  Signs of infections (fever, pain, inflammation, redness, or persistent bleeding).

## 2014-11-23 NOTE — ED Provider Notes (Signed)
CSN: [id number]     Arrival date & time 11/23/14  1817 History   First MD Initiated Contact with Patient 11/23/14 1841     Chief Complaint  Patient presents with  . Arm Injury   (Consider location/radiation/quality/duration/timing/severity/associated sxs/prior Treatment) HPI Comments: 49 year old male was playing softball this afternoon was running and fell onto his left arm. Complaining of pain primarily to the left elbow and lesser to the proximal forearm. States he is unable to fully extend or flex the elbow. He holds it in an approximately 30 angle close to his body.   Past Medical History  Diagnosis Date  . Wears contact lenses   . Dental bridge present   . Anxiety   . Depression 2008  . HIV disease 2006   Past Surgical History  Procedure Laterality Date  . Wisdom tooth extraction     Family History  Problem Relation Age of Onset  . Heart disease Maternal Aunt   . Heart disease Maternal Grandmother   . Heart disease Maternal Grandfather    History  Substance Use Topics  . Smoking status: Never Smoker   . Smokeless tobacco: Never Used  . Alcohol Use: No    Review of Systems  Constitutional: Positive for activity change.  Respiratory: Negative.   Gastrointestinal: Negative.   Musculoskeletal: Negative for back pain, joint swelling, neck pain and neck stiffness.       As per HPI  Skin: Negative.   Neurological: Negative for dizziness and headaches.    Allergies  Review of patient's allergies indicates no known allergies.  Home Medications   Prior to Admission medications   Medication Sig Start Date End Date Taking? Authorizing Provider  fexofenadine (ALLEGRA) 180 MG tablet Take 180 mg by mouth daily.    Historical Provider, MD  FLUoxetine (PROZAC) 20 MG capsule take 1 capsule by mouth once daily 12/31/13   Ronnald NianJohn C Lalonde, MD  HYDROcodone-acetaminophen (NORCO/VICODIN) 5-325 MG per tablet Take 1 tablet by mouth every 4 (four) hours as needed. 11/23/14   Hayden Rasmussenavid Kellen Hover,  NP  ISENTRESS 400 MG tablet TAKE 1 TABLET BY MOUTH TWICE A DAY 10/09/14   Ronnald NianJohn C Lalonde, MD  ISENTRESS 400 MG tablet TAKE 1 TABLET BY MOUTH TWICE A DAY 10/27/14   Ronnald NianJohn C Lalonde, MD  Testosterone (ANDROGEL PUMP) 20.25 MG/ACT (1.62%) GEL USE 4 PUMPS ONCE DAILY 11/12/14   Ronnald NianJohn C Lalonde, MD  TRUVADA 200-300 MG per tablet TAKE 1 TABLET BY MOUTH DAILY 10/27/14   Ronnald NianJohn C Lalonde, MD   BP 170/107 mmHg  Pulse 100  Temp(Src) 98.6 F (37 C) (Oral)  Resp 18  SpO2 99% Physical Exam  Constitutional: He is oriented to person, place, and time. He appears well-developed and well-nourished. No distress.  Neck: Normal range of motion. Neck supple.  Cardiovascular: Normal rate.   Pulmonary/Chest: Effort normal. No respiratory distress.  Musculoskeletal:  Evaluation of the digits, hands and wrist reveal no swelling or deformity. No tenderness. Extension and flexion of the wrist and digits are intact. Flexion of the wrist and extension produces pain along the flexor and extensors digitorum muscles. No bony tenderness to the left elbow. There is tenderness to most of the soft area tissues along the left elbow. Unable to extend the arm over 30 to the horizontal, unable to flex to 90. No swelling or discoloration is noted. Distal neurovascular and motor sensory  is intact. No tenderness, pain or swelling to the left upper arm or shoulder.  Neurological: He is alert  and oriented to person, place, and time. He exhibits normal muscle tone.  Skin: Skin is warm and dry.  Psychiatric: He has a normal mood and affect.  Nursing note and vitals reviewed.   ED Course  Procedures (including critical care time) Labs Review Labs Reviewed - No data to display  Imaging Review Dg Elbow Complete Left  11/23/2014   CLINICAL DATA:  Fall playing softball with left posterior elbow pain. Initial encounter.  EXAM: LEFT ELBOW - COMPLETE 3+ VIEW  COMPARISON:  None.  FINDINGS: Nondisplaced left radial neck acute fracture with joint  effusion. There is no definitive extension to the radial head and no irregularity of the radial head articular surface. Normal joint alignment.  IMPRESSION: Nondisplaced radial neck fracture.   Electronically Signed   By: Marnee Spring M.D.   On: 11/23/2014 19:08     MDM   1. Radial neck fracture, left, closed, initial encounter   2. Fall, initial encounter    *arm sling and see Dr. Orlan Leavens 2 d.  Ice Norco 5 mg po now and Rx for #15  Consulted with Dr. Excell Seltzer, NP 11/23/14 [phone]

## 2014-11-23 NOTE — ED Notes (Signed)
Pt states that he fell on his left arm playing softball today at 4pm

## 2015-01-06 ENCOUNTER — Encounter: Payer: Self-pay | Admitting: Family Medicine

## 2015-01-06 ENCOUNTER — Ambulatory Visit (INDEPENDENT_AMBULATORY_CARE_PROVIDER_SITE_OTHER): Payer: BLUE CROSS/BLUE SHIELD | Admitting: Family Medicine

## 2015-01-06 VITALS — BP 128/90 | HR 80 | Ht 68.0 in | Wt 260.6 lb

## 2015-01-06 DIAGNOSIS — F329 Major depressive disorder, single episode, unspecified: Secondary | ICD-10-CM

## 2015-01-06 DIAGNOSIS — E291 Testicular hypofunction: Secondary | ICD-10-CM | POA: Diagnosis not present

## 2015-01-06 DIAGNOSIS — E669 Obesity, unspecified: Secondary | ICD-10-CM | POA: Diagnosis not present

## 2015-01-06 DIAGNOSIS — B20 Human immunodeficiency virus [HIV] disease: Secondary | ICD-10-CM

## 2015-01-06 DIAGNOSIS — F32A Depression, unspecified: Secondary | ICD-10-CM

## 2015-01-06 DIAGNOSIS — Z0000 Encounter for general adult medical examination without abnormal findings: Secondary | ICD-10-CM

## 2015-01-06 DIAGNOSIS — J301 Allergic rhinitis due to pollen: Secondary | ICD-10-CM | POA: Diagnosis not present

## 2015-01-06 NOTE — Progress Notes (Signed)
Subjective:    Patient ID: Patrick Holmes, male    DOB: April 23, 1966, 49 y.o.   MRN: [id number]  HPI He is here for complete examination. He has have underlying HIV and continues on medications without difficulty. He has had no fever, chills, congestion, GI complaints. He also continues on fluoxetine for treatment of depression. He has been on this for a long time and is quite stable on it. He also continues on testosterone and states that it has helped with energy and strength. Apparently his libido was not affected. He has been in a long-term relationship but they do not enjoy any sexual activity. He does have spring and fall allergies and treats him with OTC medications. His diet and exercise pattern are unchanged. He continues to work at [employer]. Family and social history as well as immunizations and health maintenance was reviewed.   Review of Systems  All other systems reviewed and are negative.      Objective:   Physical Exam BP 128/90 mmHg  Pulse 80  Ht 5\' 8"  (1.727 m)  Wt 260 lb 9.6 oz (118.207 kg)  BMI 39.63 kg/m2  General Appearance:    Alert, cooperative, no distress, appears stated age  Head:    Normocephalic, without obvious abnormality, atraumatic  Eyes:    PERRL, conjunctiva/corneas clear, EOM's intact, fundi    benign  Ears:    Normal TM's and external ear canals  Nose:   Nares normal, mucosa normal, no drainage or sinus   tenderness  Throat:   Lips, mucosa, and tongue normal; teeth and gums normal  Neck:   Supple, no lymphadenopathy;  thyroid:  no   enlargement/tenderness/nodules; no carotid   bruit or JVD  Back:    Spine nontender, no curvature, ROM normal, no CVA     tenderness  Lungs:     Clear to auscultation bilaterally without wheezes, rales or     ronchi; respirations unlabored  Chest Wall:    No tenderness or deformity   Heart:    Regular rate and rhythm, S1 and S2 normal, no murmur, rub   or gallop  Breast Exam:    No chest wall tenderness,  masses or gynecomastia  Abdomen:     Soft, non-tender, nondistended, normoactive bowel sounds,    no masses, no hepatosplenomegaly  Genitalia:    Normal male external genitalia without lesions.  Testicles without masses.  No inguinal hernias.  Rectal:    Normal sphincter tone, no masses or tenderness; guaiac negative stool.  Prostate smooth, no nodules, not enlarged.  Extremities:   No clubbing, cyanosis or edema  Pulses:   2+ and symmetric all extremities  Skin:   Skin color, texture, turgor normal, no rashes or lesions  Lymph nodes:   Cervical, supraclavicular, and axillary nodes normal  Neurologic:   CNII-XII intact, normal strength, sensation and gait; reflexes 2+ and symmetric throughout          Psych:   Normal mood, affect, hygiene and grooming.   Blood work from October 2015 was reviewed.       Assessment & Plan:  Routine general medical examination at a health care facility  Obesity (BMI 30-39.9)  Hypogonadism male - Plan: Testosterone, PSA  Human immunodeficiency virus (HIV) disease - Plan: HIV 1 RNA quant-no reflex-bld, T-helper cells (CD4) count, CANCELED: T-helper cells (CD4) count  Depression  Allergic rhinitis due to pollen Briefly discussed his weight but at this point he is really not interested in making any  major changes in his lifestyle.

## 2015-01-07 LAB — TESTOSTERONE: TESTOSTERONE: 181 ng/dL — AB (ref 300–890)

## 2015-01-07 LAB — T-HELPER CELLS (CD4) COUNT (NOT AT ARMC)
ABSOLUTE CD4: 564 /uL (ref 381–1469)
CD4 T HELPER %: 32 % (ref 32–62)
TOTAL LYMPHOCYTE COUNT: 1763 /uL (ref 700–3300)
Total Lymphocyte: 43 % (ref 12–46)
WBC, lymph enumeration: 4.1 10*3/uL (ref 4.0–10.5)

## 2015-01-07 LAB — PSA: PSA: 0.15 ng/mL (ref <=4.00)

## 2015-01-07 LAB — HIV-1 RNA QUANT-NO REFLEX-BLD

## 2015-01-15 MED ORDER — TESTOSTERONE 20.25 MG/ACT (1.62%) TD GEL: TRANSDERMAL | Status: DC

## 2015-01-15 NOTE — Addendum Note (Signed)
Addended by: Herminio CommonsJOHNSON, Keyuna Cuthrell A on: 01/15/2015 09:04 AM   Modules accepted: Orders

## 2015-01-21 ENCOUNTER — Telehealth: Payer: Self-pay | Admitting: Family Medicine

## 2015-01-21 NOTE — Telephone Encounter (Signed)
Pt called to say that ins not going to pay for his Androgel.Called pharmacy & cost is now $200 for 3 boxes.  Pt was paying $10 for 2 boxes.  I called insurance company because [employer] Was already approved from last year.   They state that Andogel is Tier 2 and it 100% out of pocket coinsurance, not a copay. There are no Tier 1 testosterone meds. Ref# [id number].  I activated ($10 a month) discount card and now pt cost is $20 because of double dosage.  Called pt & informed & I mailed him the discount card for his records .

## 2015-02-16 ENCOUNTER — Encounter: Payer: Self-pay | Admitting: Family Medicine

## 2015-02-16 ENCOUNTER — Ambulatory Visit (INDEPENDENT_AMBULATORY_CARE_PROVIDER_SITE_OTHER): Payer: BLUE CROSS/BLUE SHIELD | Admitting: Family Medicine

## 2015-02-16 VITALS — BP 112/78 | HR 90 | Temp 98.7°F | Wt 257.0 lb

## 2015-02-16 DIAGNOSIS — J029 Acute pharyngitis, unspecified: Secondary | ICD-10-CM | POA: Diagnosis not present

## 2015-02-16 LAB — POCT RAPID STREP A (OFFICE): Rapid Strep A Screen: NEGATIVE

## 2015-02-16 NOTE — Progress Notes (Signed)
   Subjective:    Patient ID: Patrick Holmes, male    DOB: [birth year], 49 y.o.   MRN: [id number]  HPI He complains of a one-week history of sore throat, chills, fatigue, malaise. His partner also has similar symptoms.  Review of Systems     Objective:   Physical Exam Alert and in no distress. Tympanic membranes and canals are normal. Pharyngeal area Shows patchy erythema but no petechiae. Neck is supple without adenopathy or thyromegaly. Cardiac exam shows a regular sinus rhythm without murmurs or gallops. Lungs are clear to auscultation. Strep test is negative        Assessment & Plan:  Sore throat - Plan: POCT rapid strep A Recommend supportive care.

## 2015-03-02 ENCOUNTER — Other Ambulatory Visit: Payer: Self-pay | Admitting: Family Medicine

## 2015-03-03 NOTE — Telephone Encounter (Signed)
Is this okay to refill? 

## 2015-03-24 ENCOUNTER — Other Ambulatory Visit: Payer: Self-pay | Admitting: Family Medicine

## 2015-03-24 NOTE — Telephone Encounter (Signed)
This came to me by mistake

## 2015-03-24 NOTE — Telephone Encounter (Signed)
Is this okay?

## 2015-04-14 ENCOUNTER — Telehealth: Payer: Self-pay | Admitting: Family Medicine

## 2015-04-14 NOTE — Telephone Encounter (Signed)
Fax refill request from [facility]  [address]  Androgel 1.62% gel pump  Use 6 pumps everyday  Last refill  01/21/15

## 2015-04-14 NOTE — Telephone Encounter (Signed)
Okay to renew

## 2015-04-16 ENCOUNTER — Other Ambulatory Visit: Payer: Self-pay

## 2015-04-16 MED ORDER — TESTOSTERONE 20.25 MG/ACT (1.62%) TD GEL: TRANSDERMAL | Status: DC

## 2015-04-16 NOTE — Telephone Encounter (Signed)
Called in androgel per jcl 

## 2015-06-09 ENCOUNTER — Ambulatory Visit (INDEPENDENT_AMBULATORY_CARE_PROVIDER_SITE_OTHER): Payer: BLUE CROSS/BLUE SHIELD | Admitting: Family Medicine

## 2015-06-09 ENCOUNTER — Encounter: Payer: Self-pay | Admitting: Family Medicine

## 2015-06-09 VITALS — BP 120/70 | HR 78 | Ht 68.0 in | Wt 262.0 lb

## 2015-06-09 DIAGNOSIS — Z79899 Other long term (current) drug therapy: Secondary | ICD-10-CM

## 2015-06-09 DIAGNOSIS — E291 Testicular hypofunction: Secondary | ICD-10-CM

## 2015-06-09 DIAGNOSIS — R5383 Other fatigue: Secondary | ICD-10-CM | POA: Diagnosis not present

## 2015-06-09 DIAGNOSIS — B20 Human immunodeficiency virus [HIV] disease: Secondary | ICD-10-CM

## 2015-06-09 DIAGNOSIS — E669 Obesity, unspecified: Secondary | ICD-10-CM

## 2015-06-09 DIAGNOSIS — Z23 Encounter for immunization: Secondary | ICD-10-CM

## 2015-06-09 LAB — CBC WITH DIFFERENTIAL/PLATELET
BASOS PCT: 0 % (ref 0–1)
Basophils Absolute: 0 10*3/uL (ref 0.0–0.1)
EOS ABS: 0.1 10*3/uL (ref 0.0–0.7)
Eosinophils Relative: 1 % (ref 0–5)
HCT: 43 % (ref 39.0–52.0)
Hemoglobin: 15.3 g/dL (ref 13.0–17.0)
Lymphocytes Relative: 43 % (ref 12–46)
Lymphs Abs: 2.4 10*3/uL (ref 0.7–4.0)
MCH: 32.7 pg (ref 26.0–34.0)
MCHC: 35.6 g/dL (ref 30.0–36.0)
MCV: 91.9 fL (ref 78.0–100.0)
MONOS PCT: 7 % (ref 3–12)
MPV: 8.7 fL (ref 8.6–12.4)
Monocytes Absolute: 0.4 10*3/uL (ref 0.1–1.0)
Neutro Abs: 2.7 10*3/uL (ref 1.7–7.7)
Neutrophils Relative %: 49 % (ref 43–77)
PLATELETS: 175 10*3/uL (ref 150–400)
RBC: 4.68 MIL/uL (ref 4.22–5.81)
RDW: 15 % (ref 11.5–15.5)
WBC: 5.5 10*3/uL (ref 4.0–10.5)

## 2015-06-09 LAB — COMPREHENSIVE METABOLIC PANEL
ALBUMIN: 4.3 g/dL (ref 3.6–5.1)
ALT: 39 U/L (ref 9–46)
AST: 19 U/L (ref 10–40)
Alkaline Phosphatase: 68 U/L (ref 40–115)
BUN: 12 mg/dL (ref 7–25)
CHLORIDE: 104 mmol/L (ref 98–110)
CO2: 25 mmol/L (ref 20–31)
CREATININE: 0.83 mg/dL (ref 0.60–1.35)
Calcium: 9 mg/dL (ref 8.6–10.3)
Glucose, Bld: 83 mg/dL (ref 65–99)
POTASSIUM: 4.1 mmol/L (ref 3.5–5.3)
SODIUM: 141 mmol/L (ref 135–146)
Total Bilirubin: 1.2 mg/dL (ref 0.2–1.2)
Total Protein: 6.8 g/dL (ref 6.1–8.1)

## 2015-06-09 LAB — LIPID PANEL
Cholesterol: 137 mg/dL (ref 125–200)
HDL: 42 mg/dL (ref >=40)
LDL CALC: 77 mg/dL (ref <130)
Total CHOL/HDL Ratio: 3.3 Ratio (ref <=5.0)
Triglycerides: 92 mg/dL (ref <150)
VLDL: 18 mg/dL (ref <30)

## 2015-06-09 LAB — TSH: TSH: 2.143 u[IU]/mL (ref 0.350–4.500)

## 2015-06-09 NOTE — Progress Notes (Signed)
   Subjective:    Patient ID: Patrick Holmes, male    DOB: [birth year], 49 y.o.   MRN: [id number]  HPI He is here for a follow-up visit. He continues on his HIV medications and has had no difficulty with them. He does complain of some fatigue. No difficulty with fever, chills, cough, congestion, headaches. He does have underlying hypogonadism and is taking 4 pumps per day of AndroGel. Work and home continue to go well for him. Smoking and drinking were reviewed.   Review of Systems     Objective:   Physical Exam Alert and in no distress. Tympanic membranes and canals are normal. Pharyngeal area is normal. Neck is supple without adenopathy or thyromegaly. Cardiac exam shows a regular sinus rhythm without murmurs or gallops. Lungs are clear to auscultation.abdominal exam shows no masses with normal bowel sounds. No axillary or inguinal adenopathy.        Assessment & Plan:  Need for prophylactic vaccination and inoculation against influenza - Plan: Flu Vaccine QUAD 36+ mos PF IM (Fluarix & Fluzone Quad PF)  Human immunodeficiency virus (HIV) disease (HCC) - Plan: CBC with Differential/Platelet, Comprehensive metabolic panel, Lipid panel, T-helper cells (CD4) count, HIV 1 RNA quant-no reflex-bld  Obesity (BMI 30-39.9)  Hypogonadism male - Plan: Testosterone  Other fatigue - Plan: CBC with Differential/Platelet, Comprehensive metabolic panel, Testosterone, TSH  Encounter for long-term (current) use of high-risk medication - Plan: CBC with Differential/Platelet, Comprehensive metabolic panel, Lipid panel, Testosterone, T-helper cells (CD4) count, HIV 1 RNA quant-no reflex-bld difficult to say what's causing his fatigue but it could certainly be from low testosterone. I will also check TSH

## 2015-06-10 LAB — TESTOSTERONE: Testosterone: 117 ng/dL — ABNORMAL LOW (ref 300–890)

## 2015-07-27 ENCOUNTER — Telehealth: Payer: Self-pay

## 2015-07-27 NOTE — Telephone Encounter (Signed)
Okay with 2 refills

## 2015-07-27 NOTE — Telephone Encounter (Signed)
Refill request for Androgel 1.62% - [facility] on [address]

## 2015-07-28 MED ORDER — TESTOSTERONE 20.25 MG/ACT (1.62%) TD GEL: TRANSDERMAL | Status: DC

## 2015-07-28 NOTE — Telephone Encounter (Signed)
Called in med with 2 refills

## 2015-08-10 ENCOUNTER — Telehealth: Payer: Self-pay | Admitting: Family Medicine

## 2015-08-10 NOTE — Telephone Encounter (Signed)
Pt called states needs P.A. Androgel due to dosage increase P.A. ANDROGEL

## 2015-09-03 NOTE — Telephone Encounter (Signed)
It looks like he didn't come back for a testosterone so check on that and schedule it

## 2015-09-07 NOTE — Telephone Encounter (Signed)
I tried calling pt and unable to leave a message on his phone.  I never received response from P.A. So called [employer] & they are closed for Holiday.  Called pharmacy & going thru but cost $421 with discount card. [facility]ax[facility] P.A & [facility] [phone]

## 2015-09-08 ENCOUNTER — Telehealth: Payer: Self-pay

## 2015-09-08 NOTE — Telephone Encounter (Signed)
Pt has new insurance and can no longer afford his prescriptions for Truvada and Isentress. Wants to know what you want to do about this?

## 2015-09-08 NOTE — Telephone Encounter (Signed)
See what you can do

## 2015-09-09 ENCOUNTER — Telehealth: Payer: Self-pay | Admitting: Family Medicine

## 2015-09-09 NOTE — Telephone Encounter (Signed)
Talked with Misty Stanley at [facility] and she states Androgel P.A. Approved and quantity exception also approved

## 2015-09-09 NOTE — Telephone Encounter (Signed)
Called pharmacy and still not going thru,  Will try again tomorrow in case it's too soon

## 2015-09-09 NOTE — Telephone Encounter (Signed)
Called [facility] and problem is that pt has a balance and they can't ship any more meds til balance is paid.  PT needs to call t# [phone] and make arr for balance. Also Iscentress co pay program t# [phone] and Truvada t# [phone] Both have co pay assistance programs and Pt assistance programs online I called pt and unable to reach him or leave a message

## 2015-09-10 NOTE — Telephone Encounter (Signed)
Called pharmacy & Androgel did go thru and with discount card price is still $420

## 2015-09-10 NOTE — Telephone Encounter (Signed)
[facility] said yesterday that they hadn't shipped any meds since August. Recv'd mail order request for Truvada, Isentress and Fluoxetine from [facility].   Per [employer], pt needs appt to discuss. Called pt & he states he has been on his meds. Appt tomorrow

## 2015-09-10 NOTE — Telephone Encounter (Signed)
Printed co pay cards and assistance info for pt

## 2015-09-11 ENCOUNTER — Ambulatory Visit (INDEPENDENT_AMBULATORY_CARE_PROVIDER_SITE_OTHER): Payer: BLUE CROSS/BLUE SHIELD | Admitting: Family Medicine

## 2015-09-11 ENCOUNTER — Telehealth: Payer: Self-pay | Admitting: Family Medicine

## 2015-09-11 ENCOUNTER — Encounter: Payer: Self-pay | Admitting: Family Medicine

## 2015-09-11 VITALS — BP 118/68 | HR 68 | Wt 264.0 lb

## 2015-09-11 DIAGNOSIS — F329 Major depressive disorder, single episode, unspecified: Secondary | ICD-10-CM | POA: Diagnosis not present

## 2015-09-11 DIAGNOSIS — E291 Testicular hypofunction: Secondary | ICD-10-CM

## 2015-09-11 DIAGNOSIS — F32A Depression, unspecified: Secondary | ICD-10-CM

## 2015-09-11 DIAGNOSIS — B20 Human immunodeficiency virus [HIV] disease: Secondary | ICD-10-CM | POA: Diagnosis not present

## 2015-09-11 MED ORDER — RALTEGRAVIR POTASSIUM 400 MG PO TABS: 400.0000 mg | ORAL_TABLET | Freq: Two times a day (BID) | ORAL | Status: DC

## 2015-09-11 MED ORDER — EMTRICITABINE-TENOFOVIR DF 200-300 MG PO TABS: 1.0000 | ORAL_TABLET | Freq: Every day | ORAL | Status: DC

## 2015-09-11 MED ORDER — NONFORMULARY OR COMPOUNDED ITEM: 50.0000 mg | Freq: Every day | Status: DC

## 2015-09-11 MED ORDER — FLUOXETINE HCL 20 MG PO CAPS: 20.0000 mg | ORAL_CAPSULE | Freq: Every day | ORAL | Status: DC

## 2015-09-11 NOTE — Progress Notes (Signed)
   Subjective:    Patient ID: Patrick Holmes, male    DOB: March 15, 1966, 50 y.o.   MRN: [id number]  HPI He is here for an interval evaluation. He recently switched insurance plans and is dealing with getting prior authorizations for all of his medications it was originally thought that he was not getting his HIV meds in a timely manner however he has been diligent in taking them regularly as well as his Prozac. He is having difficulty getting testosterone due to the high cost from his new insurance. It would cost him over $400 even with discount cards.   Review of Systems     Objective:   Physical Exam Alert and in no distress otherwise not examined      Assessment & Plan:  Human immunodeficiency virus (HIV) disease (HCC) - Plan: raltegravir (ISENTRESS) 400 MG tablet, emtricitabine-tenofovir (TRUVADA) 200-300 MG tablet  Hypogonadism male  Depression - Plan: FLUoxetine (PROZAC) 20 MG capsule We are in the process of helping him get his medications through his new insurance plan. We will continue him on the same medications however testosterone will be sent to a compounding pharmacy. History turned here in one month for follow-up visit.

## 2015-09-11 NOTE — Telephone Encounter (Signed)
Called [facility] t# [phone] Testosterone compounding cream 5% apply 1 ml once daily =  qd, dispense 30 ml for 30 days Cost $42.27 Per pharmacist recommend increasing after 30 days to 10% for 60 days & then increase as necessary

## 2015-09-14 ENCOUNTER — Telehealth: Payer: Self-pay | Admitting: Family Medicine

## 2015-09-14 NOTE — Telephone Encounter (Signed)
Pt informed and [facility] said they will call him today

## 2015-09-14 NOTE — Telephone Encounter (Signed)
Pt called & states [facility] will not use the co pay assistance cards. I called them and they state must go thru [facility] pharmacy. I called [facility] t# [phone] & gave Rx info and also co pay card info

## 2015-09-17 NOTE — Telephone Encounter (Signed)
Pt called & states he hasn't heard anything from [facility], he will call them at [phone]

## 2015-09-30 NOTE — Telephone Encounter (Signed)
Pt states everything is taken care of.

## 2015-10-13 ENCOUNTER — Ambulatory Visit (INDEPENDENT_AMBULATORY_CARE_PROVIDER_SITE_OTHER): Payer: BLUE CROSS/BLUE SHIELD | Admitting: Family Medicine

## 2015-10-13 ENCOUNTER — Encounter: Payer: Self-pay | Admitting: Family Medicine

## 2015-10-13 ENCOUNTER — Ambulatory Visit: Payer: BLUE CROSS/BLUE SHIELD | Admitting: Medical

## 2015-10-13 VITALS — BP 136/90 | HR 68 | Ht 68.0 in | Wt 263.0 lb

## 2015-10-13 DIAGNOSIS — E291 Testicular hypofunction: Secondary | ICD-10-CM | POA: Diagnosis not present

## 2015-10-13 NOTE — Progress Notes (Signed)
   Subjective:    Patient ID: Patrick Holmes, male    DOB: Sep 20, 1965, 50 y.o.   MRN: [id number]  HPI Is here for recheck. He was switched to a different testosterone preparation due to insurance purposes. He can tell no difference in the two medications.he notes no difference in energy, stamina, strength or livedo   Review of Systems     Objective:   Physical Exam Alert and in no distress otherwise not examined       Assessment & Plan:  Hypogonadism male - Plan: Testosterone

## 2015-10-14 ENCOUNTER — Other Ambulatory Visit: Payer: Self-pay | Admitting: Family Medicine

## 2015-10-14 LAB — TESTOSTERONE: Testosterone: 58 ng/dL — ABNORMAL LOW (ref 250–827)

## 2015-10-14 MED ORDER — NONFORMULARY OR COMPOUNDED ITEM: Status: DC

## 2015-10-14 NOTE — Addendum Note (Signed)
Addended by: Herminio Commons A on: 10/14/2015 04:31 PM   Modules accepted: Orders, Medications

## 2015-10-14 NOTE — Telephone Encounter (Signed)
Pt had called back and asked that his testosterone go to [facility] t# [phone] I called in Testosterone compounding cream to [facility] refill for double strength 10% 2 applications per day per [facility]

## 2016-05-03 ENCOUNTER — Encounter: Payer: Self-pay | Admitting: Family Medicine

## 2016-05-03 ENCOUNTER — Ambulatory Visit (INDEPENDENT_AMBULATORY_CARE_PROVIDER_SITE_OTHER): Payer: BLUE CROSS/BLUE SHIELD | Admitting: Family Medicine

## 2016-05-03 VITALS — BP 122/80 | HR 82 | Ht 68.0 in | Wt 257.0 lb

## 2016-05-03 DIAGNOSIS — E291 Testicular hypofunction: Secondary | ICD-10-CM | POA: Diagnosis not present

## 2016-05-03 DIAGNOSIS — J301 Allergic rhinitis due to pollen: Secondary | ICD-10-CM | POA: Diagnosis not present

## 2016-05-03 DIAGNOSIS — F329 Major depressive disorder, single episode, unspecified: Secondary | ICD-10-CM | POA: Diagnosis not present

## 2016-05-03 DIAGNOSIS — F32A Depression, unspecified: Secondary | ICD-10-CM

## 2016-05-03 DIAGNOSIS — E669 Obesity, unspecified: Secondary | ICD-10-CM | POA: Diagnosis not present

## 2016-05-03 DIAGNOSIS — Z23 Encounter for immunization: Secondary | ICD-10-CM | POA: Diagnosis not present

## 2016-05-03 DIAGNOSIS — B20 Human immunodeficiency virus [HIV] disease: Secondary | ICD-10-CM

## 2016-05-03 LAB — CBC WITH DIFFERENTIAL/PLATELET
Basophils Absolute: 0 cells/uL (ref 0–200)
Basophils Relative: 0 %
EOS ABS: 126 {cells}/uL (ref 15–500)
Eosinophils Relative: 3 %
HEMATOCRIT: 41.2 % (ref 38.5–50.0)
HEMOGLOBIN: 14.7 g/dL (ref 13.2–17.1)
Lymphocytes Relative: 50 %
Lymphs Abs: 2100 cells/uL (ref 850–3900)
MCH: 32.8 pg (ref 27.0–33.0)
MCHC: 35.7 g/dL (ref 32.0–36.0)
MCV: 92 fL (ref 80.0–100.0)
MONO ABS: 294 {cells}/uL (ref 200–950)
MPV: 9 fL (ref 7.5–12.5)
Monocytes Relative: 7 %
NEUTROS PCT: 40 %
Neutro Abs: 1680 cells/uL (ref 1500–7800)
Platelets: 174 10*3/uL (ref 140–400)
RBC: 4.48 MIL/uL (ref 4.20–5.80)
RDW: 14 % (ref 11.0–15.0)
WBC: 4.2 10*3/uL (ref 4.0–10.5)

## 2016-05-03 LAB — COMPREHENSIVE METABOLIC PANEL
ALBUMIN: 4.2 g/dL (ref 3.6–5.1)
ALT: 35 U/L (ref 9–46)
AST: 19 U/L (ref 10–40)
Alkaline Phosphatase: 64 U/L (ref 40–115)
BILIRUBIN TOTAL: 1.4 mg/dL — AB (ref 0.2–1.2)
BUN: 9 mg/dL (ref 7–25)
CALCIUM: 9.4 mg/dL (ref 8.6–10.3)
CO2: 25 mmol/L (ref 20–31)
Chloride: 108 mmol/L (ref 98–110)
Creat: 0.73 mg/dL (ref 0.60–1.35)
GLUCOSE: 95 mg/dL (ref 65–99)
POTASSIUM: 4 mmol/L (ref 3.5–5.3)
Sodium: 141 mmol/L (ref 135–146)
Total Protein: 6.5 g/dL (ref 6.1–8.1)

## 2016-05-03 LAB — LIPID PANEL
CHOL/HDL RATIO: 2.9 ratio (ref <=5.0)
Cholesterol: 134 mg/dL (ref 125–200)
HDL: 46 mg/dL (ref >=40)
LDL CALC: 70 mg/dL (ref <130)
TRIGLYCERIDES: 91 mg/dL (ref <150)
VLDL: 18 mg/dL (ref <30)

## 2016-05-03 NOTE — Progress Notes (Signed)
   Subjective:    Patient ID: Patrick Holmes, male    DOB: 1966-07-16, 50 y.o.   MRN: [id number]  HPI He is here for a complete examination. He has stopped taking the compounded testosterone stating it wasn't quite as effective as the branded product and also cause difficulty with acne. He has noted difficulty with energy and stamina. His allergies are under good control. He continues on Prozac for his depression and is quite happy with the results. He has no intentions of stopping. He continues on his HIV meds and has had no difficulty with headache, fever, weight changes, GI symptoms. His weight is unchanged. Work and home life are going well. Smoking and drinking were reviewed.   Review of Systems     Objective:   Physical Exam Alert and in no distress. Tympanic membranes and canals are normal. Pharyngeal area is normal. Neck is supple without adenopathy or thyromegaly. Cardiac exam shows a regular sinus rhythm without murmurs or gallops. Lungs are clear to auscultation. Dominant exam shows no masses or tenderness with normal bowel sounds. Fundi are benign.       Assessment & Plan:  Hypogonadism male  Non-seasonal allergic rhinitis due to pollen  Depression  Human immunodeficiency virus (HIV) disease (HCC) - Plan: CBC with Differential/Platelet, Comprehensive metabolic panel, Lipid panel, HIV 1 RNA quant-no reflex-bld, T-helper cells (CD4) count (not at [facility])  Obesity (BMI 30-39.9) - Plan: CBC with Differential/Platelet, Comprehensive metabolic panel, Lipid panel  Need for prophylactic vaccination and inoculation against influenza - Plan: Flu Vaccine QUAD 36+ mos IM Recommend that he check with his insurance, Their policy concerning testosterone supplementation and cost. He will let me know. Also discussed possibly wetting until next year when his insurance might possibly change. Otherwise he will continue on present medication regimen.

## 2016-05-04 ENCOUNTER — Telehealth: Payer: Self-pay

## 2016-05-04 LAB — T-HELPER CELLS (CD4) COUNT (NOT AT ARMC)
Absolute CD4: 738 cells/uL (ref 381–1469)
CD4 T HELPER %: 33 % (ref 32–62)
Total lymphocyte count: 2243 cells/uL (ref 700–3300)

## 2016-05-04 NOTE — Telephone Encounter (Signed)
Pt needs refill on [facility]el to [facility]. Trixie Rude/RLB

## 2016-05-04 NOTE — Telephone Encounter (Signed)
Fax or call out a 90 day supply, no refill

## 2016-05-05 ENCOUNTER — Telehealth: Payer: Self-pay

## 2016-05-05 LAB — HIV-1 RNA QUANT-NO REFLEX-BLD
HIV 1 RNA Quant: <20 copies/mL (ref <20)
HIV-1 RNA Quant, Log: <1.3 Log copies/mL (ref <1.30)

## 2016-05-05 NOTE — Telephone Encounter (Signed)
Call[facility]rogel [facility]. Trixie Rude/RLB

## 2016-05-05 NOTE — Telephone Encounter (Signed)
[facility]isa at [facility]- she states that they can only fill for 60 days. Approved this for her and note made in chart.

## 2016-08-08 ENCOUNTER — Telehealth: Payer: Self-pay

## 2016-08-08 DIAGNOSIS — F32A Depression, unspecified: Secondary | ICD-10-CM

## 2016-08-08 DIAGNOSIS — F329 Major depressive disorder, single episode, unspecified: Secondary | ICD-10-CM

## 2016-08-08 MED ORDER — FLUOXETINE HCL 20 MG PO CAPS: 20.0000 mg | ORAL_CAPSULE | Freq: Every day | ORAL | 3 refills | Status: DC

## 2016-08-08 NOTE — Telephone Encounter (Signed)
Fluoxentine

## 2016-08-08 NOTE — Telephone Encounter (Signed)
This is Dr. Jola BabinskiLalonde's patient, and the medication is not listed.

## 2016-08-08 NOTE — Telephone Encounter (Signed)
Needs 90 day supply [facility]. Trixie Rude/RLB

## 2016-08-09 ENCOUNTER — Other Ambulatory Visit: Payer: Self-pay

## 2016-08-09 DIAGNOSIS — F329 Major depressive disorder, single episode, unspecified: Secondary | ICD-10-CM

## 2016-08-09 DIAGNOSIS — F32A Depression, unspecified: Secondary | ICD-10-CM

## 2016-08-09 MED ORDER — FLUOXETINE HCL 20 MG PO CAPS: 20.0000 mg | ORAL_CAPSULE | Freq: Every day | ORAL | 0 refills | Status: DC

## 2016-08-19 ENCOUNTER — Telehealth: Payer: Self-pay | Admitting: Medical

## 2016-08-19 NOTE — Telephone Encounter (Signed)
[facility]

## 2016-08-19 NOTE — Telephone Encounter (Signed)
He needs to come in for testosterone blood draw.

## 2016-08-23 ENCOUNTER — Other Ambulatory Visit: Payer: BLUE CROSS/BLUE SHIELD

## 2016-08-23 ENCOUNTER — Other Ambulatory Visit: Payer: Self-pay

## 2016-08-23 DIAGNOSIS — R7989 Other specified abnormal findings of blood chemistry: Secondary | ICD-10-CM

## 2016-08-23 NOTE — Telephone Encounter (Signed)
I have put the future order in left message for pt to make nurse visit for his testosterone check

## 2016-08-23 NOTE — Telephone Encounter (Signed)
Pt has come in had draw

## 2016-08-24 LAB — TESTOSTERONE: Testosterone: 28 ng/dL — ABNORMAL LOW (ref 250–827)

## 2016-08-24 NOTE — Progress Notes (Signed)
His most recent testosterone level was when he was not on replacement. He is in the process of getting prior approval. He is to come back in for a repeat testosterone when he is on testosterone replacement for one month.

## 2016-09-07 ENCOUNTER — Telehealth: Payer: Self-pay | Admitting: Family Medicine

## 2016-09-07 NOTE — Telephone Encounter (Signed)
Called pharmacy and was closed,   Need to find out if pt is on testosterone therapy or if needs a P.A

## 2016-09-08 ENCOUNTER — Telehealth: Payer: Self-pay

## 2016-09-08 ENCOUNTER — Other Ambulatory Visit: Payer: Self-pay

## 2016-09-08 MED ORDER — TESTOSTERONE 20.25 MG/ACT (1.62%) TD GEL: 2.0000 | Freq: Every day | TRANSDERMAL | 0 refills | Status: DC

## 2016-09-08 NOTE — Telephone Encounter (Signed)
What is his dosing?

## 2016-09-08 NOTE — Telephone Encounter (Signed)
Patient called this morning said Androgel was to be called in last week ? I do not see this in his record pleas advise

## 2016-09-08 NOTE — Telephone Encounter (Signed)
Take care of this 

## 2016-09-08 NOTE — Telephone Encounter (Signed)
Called in 90 day 

## 2016-09-08 NOTE — Telephone Encounter (Signed)
It looks like he is switching to AndroGel from compounded sawblade go with 2 pumps but have him apply it to the lateral aspect of his chest and recheck here in a month

## 2016-09-13 ENCOUNTER — Telehealth: Payer: Self-pay | Admitting: Family Medicine

## 2016-09-13 NOTE — Telephone Encounter (Signed)
Pt states he hasn't recv'd his Truvada, nor his Testosterone.  He looked online and doesn't see the Rx for Testosterone.  He will go online & activate a new Androgel discount card & send it to my email & I will call [facility].

## 2016-09-14 ENCOUNTER — Telehealth: Payer: Self-pay | Admitting: Family Medicine

## 2016-09-14 DIAGNOSIS — B20 Human immunodeficiency virus [HIV] disease: Secondary | ICD-10-CM

## 2016-09-14 MED ORDER — RALTEGRAVIR POTASSIUM 400 MG PO TABS: 400.0000 mg | ORAL_TABLET | Freq: Two times a day (BID) | ORAL | 1 refills | Status: DC

## 2016-09-14 MED ORDER — EMTRICITABINE-TENOFOVIR DF 200-300 MG PO TABS: 1.0000 | ORAL_TABLET | Freq: Every day | ORAL | 1 refills | Status: DC

## 2016-09-14 NOTE — Telephone Encounter (Signed)
[facility].  Testosterone is $1387, I gave them Androgel discount card info but it will only save around $300 off 90 days who probably going to still be around $1000 for 90 days.  It will take around 24 hours to process for exact amount.  They need discount card for the Truvada.  I called pt and left a message

## 2016-09-14 NOTE — Telephone Encounter (Signed)
Pt needs refills Truvada sent to [facility]

## 2016-09-26 NOTE — Telephone Encounter (Signed)
Called pt and he will call [facility] [phone]. Then we will see what he can afford with the Androgel.  He has tried the compounding testosterone replacement before and he couldn't tolerate it.   He will let me know one he finds out

## 2016-09-28 ENCOUNTER — Other Ambulatory Visit: Payer: Self-pay | Admitting: Family Medicine

## 2016-09-28 DIAGNOSIS — B20 Human immunodeficiency virus [HIV] disease: Secondary | ICD-10-CM

## 2016-09-29 NOTE — Telephone Encounter (Signed)
Left word for word message pt needs med check next month

## 2016-09-29 NOTE — Telephone Encounter (Signed)
Make sure he gets scheduled for a med check next month

## 2016-09-29 NOTE — Telephone Encounter (Signed)
Let's start the process and see how it goes

## 2016-09-29 NOTE — Telephone Encounter (Signed)
Is this okay to refill? 

## 2016-09-29 NOTE — Telephone Encounter (Signed)
I asked him about that before & he said he couldn't use the compounded testosterone, he tried it in the past and had an allergic reaction to it topically

## 2016-09-29 NOTE — Telephone Encounter (Signed)
Dr. Susann GivensLalonde, this is the pt I discussed with you about the possibility of changing to the new Natesto nasal testosterone that would cost him a lot less. He should get 2 months free then pay $150 a month/fill.  The dosing is apply 1 pump in each nostril 3x daily, 3 dispensers/barrels = 30 day supply which is 21.96g

## 2016-09-29 NOTE — Telephone Encounter (Signed)
Another option would be to call in the compounded testosterone to [facility]. I think this is between 50 and $75 per month. Echo on the price and find out from him which one he wants to do

## 2016-10-01 ENCOUNTER — Telehealth: Payer: Self-pay | Admitting: Medical

## 2016-10-01 NOTE — Telephone Encounter (Signed)
P.A. TRUVADA 

## 2016-10-10 NOTE — Telephone Encounter (Signed)
Left message for pt to see if he wants to try

## 2016-10-10 NOTE — Telephone Encounter (Signed)
Recv'd notice no P.A. Required,  Called [facility] and Truvada does not require P.A. And payment is receiving his medication with no problems, just received meds 09/30/16.  Left message for pt

## 2016-10-27 ENCOUNTER — Other Ambulatory Visit: Payer: Self-pay | Admitting: Family Medicine

## 2016-10-27 DIAGNOSIS — F32A Depression, unspecified: Secondary | ICD-10-CM

## 2016-10-27 DIAGNOSIS — F329 Major depressive disorder, single episode, unspecified: Secondary | ICD-10-CM

## 2016-10-28 NOTE — Telephone Encounter (Signed)
Left another message for pt.

## 2017-03-24 ENCOUNTER — Other Ambulatory Visit: Payer: Self-pay | Admitting: Family Medicine

## 2017-03-24 DIAGNOSIS — B20 Human immunodeficiency virus [HIV] disease: Secondary | ICD-10-CM

## 2017-03-24 NOTE — Telephone Encounter (Signed)
He needs an appointment

## 2017-03-24 NOTE — Telephone Encounter (Signed)
Is this okay?

## 2017-03-27 NOTE — Telephone Encounter (Signed)
error 

## 2017-03-28 ENCOUNTER — Encounter: Payer: Self-pay | Admitting: Family Medicine

## 2017-04-13 ENCOUNTER — Encounter: Payer: Self-pay | Admitting: Family Medicine

## 2017-04-20 ENCOUNTER — Ambulatory Visit (INDEPENDENT_AMBULATORY_CARE_PROVIDER_SITE_OTHER): Payer: BLUE CROSS/BLUE SHIELD | Admitting: Family Medicine

## 2017-04-20 ENCOUNTER — Encounter: Payer: Self-pay | Admitting: Family Medicine

## 2017-04-20 VITALS — BP 138/88 | HR 76 | Ht 68.0 in | Wt 262.0 lb

## 2017-04-20 DIAGNOSIS — B20 Human immunodeficiency virus [HIV] disease: Secondary | ICD-10-CM

## 2017-04-20 DIAGNOSIS — F329 Major depressive disorder, single episode, unspecified: Secondary | ICD-10-CM

## 2017-04-20 DIAGNOSIS — Z1159 Encounter for screening for other viral diseases: Secondary | ICD-10-CM

## 2017-04-20 DIAGNOSIS — E669 Obesity, unspecified: Secondary | ICD-10-CM

## 2017-04-20 DIAGNOSIS — E291 Testicular hypofunction: Secondary | ICD-10-CM

## 2017-04-20 DIAGNOSIS — Z23 Encounter for immunization: Secondary | ICD-10-CM

## 2017-04-20 DIAGNOSIS — Z1211 Encounter for screening for malignant neoplasm of colon: Secondary | ICD-10-CM

## 2017-04-20 DIAGNOSIS — J301 Allergic rhinitis due to pollen: Secondary | ICD-10-CM

## 2017-04-20 DIAGNOSIS — F32A Depression, unspecified: Secondary | ICD-10-CM

## 2017-04-20 LAB — CBC WITH DIFFERENTIAL/PLATELET
BASOS PCT: 0 %
Basophils Absolute: 0 cells/uL (ref 0–200)
Eosinophils Absolute: 102 cells/uL (ref 15–500)
Eosinophils Relative: 2 %
HEMATOCRIT: 44.5 % (ref 38.5–50.0)
HEMOGLOBIN: 15.2 g/dL (ref 13.2–17.1)
LYMPHS ABS: 2091 {cells}/uL (ref 850–3900)
Lymphocytes Relative: 41 %
MCH: 31.4 pg (ref 27.0–33.0)
MCHC: 34.2 g/dL (ref 32.0–36.0)
MCV: 91.9 fL (ref 80.0–100.0)
MONO ABS: 306 {cells}/uL (ref 200–950)
MPV: 8.6 fL (ref 7.5–12.5)
Monocytes Relative: 6 %
Neutro Abs: 2601 cells/uL (ref 1500–7800)
Neutrophils Relative %: 51 %
Platelets: 196 10*3/uL (ref 140–400)
RBC: 4.84 MIL/uL (ref 4.20–5.80)
RDW: 14.2 % (ref 11.0–15.0)
WBC: 5.1 10*3/uL (ref 4.0–10.5)

## 2017-04-20 MED ORDER — FLUOXETINE HCL 20 MG PO CAPS: 20.0000 mg | ORAL_CAPSULE | Freq: Every day | ORAL | 3 refills | Status: DC

## 2017-04-20 MED ORDER — RALTEGRAVIR POTASSIUM 400 MG PO TABS: 400.0000 mg | ORAL_TABLET | Freq: Two times a day (BID) | ORAL | 0 refills | Status: DC

## 2017-04-20 MED ORDER — EMTRICITABINE-TENOFOVIR DF 200-300 MG PO TABS: 1.0000 | ORAL_TABLET | Freq: Every day | ORAL | 0 refills | Status: DC

## 2017-04-20 NOTE — Progress Notes (Signed)
   Subjective:    Patient ID: Patrick Holmes, male    DOB: 29-May-1966, 51 y.o.   MRN: [id number]  HPI He is here for a recheck visit. He continues on his HIV medications and has had no difficulty with them. He does have underlying allergies and a give him no particular troubles. He is now [age] and will need a colonoscopy. He is doing well on his Prozac. He has tried several times to stop but his symptoms have reoccurred. He is no longer on testosterone stating it really didn't help him any. His weight is unchanged and he is comfortable with it. Review of the record indicates concerned about his hepatitis B status. He does not smoke or drink. He is in a 26 year relationship. They're not sexually active. He's had no fever, chills, headaches, GI or GU issues.   Review of Systems     Objective:   Physical Exam Alert and in no distress. Tympanic membranes and canals are normal. Pharyngeal area is normal. Neck is supple without adenopathy or thyromegaly. Cardiac exam shows a regular sinus rhythm without murmurs or gallops. Lungs are clear to auscultation. Abdominal exam shows normal bowel sounds without hepatosplenomegaly.        Assessment & Plan:  Human immunodeficiency virus (HIV) disease (HCC) - Plan: raltegravir (ISENTRESS) 400 MG tablet, emtricitabine-tenofovir (TRUVADA) 200-300 MG tablet, CBC with Differential/Platelet, Comprehensive metabolic panel, Lipid panel, HIV 1 RNA quant-no reflex-bld, T-helper cells (CD4) count (not at [facility])  Need for influenza vaccination - Plan: Flu Vaccine QUAD 6+ mos PF IM (Fluarix Quad PF)  Screening for colon cancer - Plan: Ambulatory referral to Gastroenterology  Depression, unspecified depression type - Plan: FLUoxetine (PROZAC) 20 MG capsule  Need for hepatitis B screening test - Plan: Hepatitis B surface antibody  Obesity (BMI 30-39.9)  Hypogonadism male  Non-seasonal allergic rhinitis due to pollen I will continue him on his present medication  regimen but when I get the blood results back, I will switch him to [facility]. He is about to run out of his other HIV meds and I do not want that to happen. Shot given today.

## 2017-04-21 ENCOUNTER — Encounter: Payer: Self-pay | Admitting: Internal Medicine

## 2017-04-21 ENCOUNTER — Telehealth: Payer: Self-pay | Admitting: Family Medicine

## 2017-04-21 DIAGNOSIS — F32A Depression, unspecified: Secondary | ICD-10-CM

## 2017-04-21 DIAGNOSIS — F329 Major depressive disorder, single episode, unspecified: Secondary | ICD-10-CM

## 2017-04-21 LAB — COMPREHENSIVE METABOLIC PANEL
ALBUMIN: 4.6 g/dL (ref 3.6–5.1)
ALK PHOS: 82 U/L (ref 40–115)
ALT: 49 U/L — AB (ref 9–46)
AST: 24 U/L (ref 10–35)
BILIRUBIN TOTAL: 1.2 mg/dL (ref 0.2–1.2)
BUN: 11 mg/dL (ref 7–25)
CALCIUM: 9.5 mg/dL (ref 8.6–10.3)
CO2: 22 mmol/L (ref 20–32)
Chloride: 105 mmol/L (ref 98–110)
Creat: 0.84 mg/dL (ref 0.70–1.33)
Glucose, Bld: 83 mg/dL (ref 65–99)
Potassium: 3.8 mmol/L (ref 3.5–5.3)
Sodium: 143 mmol/L (ref 135–146)
Total Protein: 6.9 g/dL (ref 6.1–8.1)

## 2017-04-21 LAB — HEPATITIS B SURFACE ANTIBODY, QUANTITATIVE: Hepatitis B-Post: 76 m[IU]/mL (ref >=10)

## 2017-04-21 LAB — LIPID PANEL
Cholesterol: 154 mg/dL (ref <200)
HDL: 47 mg/dL (ref >40)
LDL Cholesterol: 87 mg/dL (ref <100)
TRIGLYCERIDES: 98 mg/dL (ref <150)
Total CHOL/HDL Ratio: 3.3 Ratio (ref <5.0)
VLDL: 20 mg/dL (ref <30)

## 2017-04-21 LAB — T-HELPER CELLS (CD4) COUNT (NOT AT ARMC)
ABSOLUTE CD4: 956 {cells}/uL (ref 490–1740)
CD4 T HELPER %: 37 % (ref 30–61)
Total lymphocyte count: 2568 cells/uL (ref 850–3900)

## 2017-04-21 MED ORDER — FLUOXETINE HCL 20 MG PO CAPS: 20.0000 mg | ORAL_CAPSULE | Freq: Every day | ORAL | 3 refills | Status: DC

## 2017-04-21 NOTE — Telephone Encounter (Signed)
[facility]. Trixie Rude/RLB

## 2017-04-21 NOTE — Telephone Encounter (Signed)
Rcvd fax from [facility] stating that Fluoxetine script should not have been sent to them since it is a non-specialty med and they asked that this script be sent to pt's local pharmacy

## 2017-04-26 ENCOUNTER — Encounter: Payer: Self-pay | Admitting: Gastroenterology

## 2017-04-26 LAB — HIV-1 RNA QUANT-NO REFLEX-BLD
HIV 1 RNA QUANT: NOT DETECTED {copies}/mL
HIV-1 RNA Quant, Log: <1.3 Log copies/mL

## 2017-05-30 ENCOUNTER — Other Ambulatory Visit: Payer: Self-pay | Admitting: Family Medicine

## 2017-05-30 DIAGNOSIS — B20 Human immunodeficiency virus [HIV] disease: Secondary | ICD-10-CM

## 2017-05-31 NOTE — Telephone Encounter (Signed)
Is this okay to refill? 

## 2017-06-01 ENCOUNTER — Telehealth: Payer: Self-pay

## 2017-06-01 NOTE — Telephone Encounter (Signed)
Patient was seen on 04/20/2017, please advise if next f/u is 3 or 6 months?  Thank you, Lurena Joiner

## 2017-06-01 NOTE — Telephone Encounter (Signed)
Have him schedule an appt

## 2017-06-01 NOTE — Telephone Encounter (Signed)
It looked like he wanted more Truvada so probably was a message from th pharmacy. Make sure that he has enough til when I see him

## 2017-06-01 NOTE — Telephone Encounter (Signed)
Needs an appt

## 2017-06-01 NOTE — Telephone Encounter (Signed)
Pt states that he spoke with you last visit about a new HIV medication, he recently got the truvada filled. Pt has appt scheduled on 06/27/2017, do you want to see him prior to discuss med further? He is not sure why he needs to be seen sooner. Trixie Rude

## 2017-06-02 NOTE — Telephone Encounter (Signed)
PT advised ok to wait until appt in November. Trixie Rude

## 2017-06-02 NOTE — Telephone Encounter (Signed)
LMTCB

## 2017-06-12 ENCOUNTER — Encounter: Payer: Self-pay | Admitting: Gastroenterology

## 2017-06-12 ENCOUNTER — Ambulatory Visit (AMBULATORY_SURGERY_CENTER): Payer: Self-pay | Admitting: *Deleted

## 2017-06-12 VITALS — Ht 68.0 in | Wt 263.2 lb

## 2017-06-12 DIAGNOSIS — Z1211 Encounter for screening for malignant neoplasm of colon: Secondary | ICD-10-CM

## 2017-06-12 MED ORDER — NA SULFATE-K SULFATE-MG SULF 17.5-3.13-1.6 GM/177ML PO SOLN: 1.0000 [IU] | Freq: Once | ORAL | 0 refills | Status: AC

## 2017-06-12 NOTE — Progress Notes (Signed)
No egg or soy allergy known to patient  No issues with past sedation with any surgeries  or procedures, no intubation problems  No diet pills per patient  No home 02 use per patient  No blood thinners per patient  Pt denies issues with constipation  No A fib or A flutter  EMMI video sent to pt's e mail   Pt. decline

## 2017-06-26 ENCOUNTER — Encounter: Payer: BLUE CROSS/BLUE SHIELD | Admitting: Gastroenterology

## 2017-06-26 ENCOUNTER — Telehealth: Payer: Self-pay | Admitting: *Deleted

## 2017-06-26 NOTE — Telephone Encounter (Signed)
Pt was scheduled for colonoscopy today with Dr. Christella HartiganJacobs.  He drank the first bottle of prep last night, was able to keep it down for awhile but then vomited up the contents.  He did not have a bowel movement after.  He attempted to call the on call doctor at [phone] and never reached anyone.  He then called his pharmacy.  They suggested that he try miralax which he did.  He was able to drink the miralax without any problems but soon after he vomited that up as well.  He never attempted the second dose of suprep but showed up for his procedure this morning. He was unable to have a bowel movement during this time.  I spoke with Dr. Christella HartiganJacobs and advised patient that [facility] will be calling him to reschedule.  Pt verbalized understanding.

## 2017-06-26 NOTE — Telephone Encounter (Signed)
The pt has been scheduled for previsit and colon he has been instructed.

## 2017-06-26 NOTE — Telephone Encounter (Signed)
Oops,  Patty, see below

## 2017-06-26 NOTE — Telephone Encounter (Signed)
Patty, Can you contact him about rescheduling this. Split dose miralax prep with zofran 4mg  1 hour prior to each dose of prep.    thanks

## 2017-06-27 ENCOUNTER — Ambulatory Visit (INDEPENDENT_AMBULATORY_CARE_PROVIDER_SITE_OTHER): Payer: BLUE CROSS/BLUE SHIELD | Admitting: Family Medicine

## 2017-06-27 ENCOUNTER — Encounter: Payer: Self-pay | Admitting: Family Medicine

## 2017-06-27 VITALS — BP 140/100 | HR 74 | Wt 261.8 lb

## 2017-06-27 DIAGNOSIS — B20 Human immunodeficiency virus [HIV] disease: Secondary | ICD-10-CM

## 2017-06-27 MED ORDER — BICTEGRAVIR-EMTRICITAB-TENOFOV 50-200-25 MG PO TABS: 1.0000 | ORAL_TABLET | Freq: Every day | ORAL | 3 refills | Status: DC

## 2017-06-27 NOTE — Progress Notes (Signed)
   Subjective:    Patient ID: Patrick Holmes, male    DOB: Oct 07, 1965, 51 y.o.   MRN: [id number]  HPI He is here for consult concerning switching to a new HIV medication. He is about to run out of Truvada. On his last visit I did give him a discount card. Also recently he had difficulty with his colonoscopy prep. He was unable to complete due to vomiting. This has been rescheduled for after Thanksgiving.   Review of Systems     Objective:   Physical Exam Alert and in no distress otherwise not examined His last blood work looked really good.      Assessment & Plan:  Human immunodeficiency virus (HIV) disease (HCC) - Plan: bictegravir-emtricitabine-tenofovir AF (BIKTARVY) 50-200-25 MG TABS tablet

## 2017-07-18 ENCOUNTER — Telehealth: Payer: Self-pay

## 2017-07-18 NOTE — Telephone Encounter (Signed)
Patient No Showed for Pre- Visit. Patient was called to reschedule.   No answer. A message was left on his phone to call and reschedule before 5:00 Pm today. Patient was informed that his colonoscopy that is scheduled on 08/08/17 will be cancelled if we don't hear from him by 5:00 pm. If he doesn't reschedule, a no show letter will be mailed after 5:00 Pm.  Janalee DaneNancy Paisly Fingerhut, LPN  ( PV )

## 2017-07-21 ENCOUNTER — Encounter: Payer: BLUE CROSS/BLUE SHIELD | Admitting: Gastroenterology

## 2017-08-04 ENCOUNTER — Telehealth: Payer: Self-pay | Admitting: Gastroenterology

## 2017-08-04 NOTE — Telephone Encounter (Signed)
A user error has taken place.

## 2017-08-08 ENCOUNTER — Other Ambulatory Visit: Payer: Self-pay | Admitting: Family Medicine

## 2017-08-08 ENCOUNTER — Encounter: Payer: BLUE CROSS/BLUE SHIELD | Admitting: Gastroenterology

## 2017-08-08 DIAGNOSIS — F329 Major depressive disorder, single episode, unspecified: Secondary | ICD-10-CM

## 2017-08-08 DIAGNOSIS — F32A Depression, unspecified: Secondary | ICD-10-CM

## 2017-08-08 NOTE — Telephone Encounter (Signed)
Is this ok to refill?  

## 2017-09-26 ENCOUNTER — Telehealth: Payer: Self-pay | Admitting: Family Medicine

## 2017-09-26 NOTE — Telephone Encounter (Signed)
Recv'd fax from [facility] stating has been trying to reach pt regarding shipment of Biktarvy.  Called pt and he states he has contacted them 3 times and has been told they are out of stock, he is going to call them this afternoon and see if they have it in stock.  I advised pt if they are still out of stock to call me back and I call them.

## 2017-09-28 ENCOUNTER — Ambulatory Visit: Payer: BLUE CROSS/BLUE SHIELD | Admitting: Family Medicine

## 2017-10-09 ENCOUNTER — Encounter: Payer: BLUE CROSS/BLUE SHIELD | Admitting: Gastroenterology

## 2017-10-17 ENCOUNTER — Ambulatory Visit: Payer: BLUE CROSS/BLUE SHIELD | Admitting: Family Medicine

## 2017-10-24 ENCOUNTER — Ambulatory Visit: Payer: BLUE CROSS/BLUE SHIELD | Admitting: Family Medicine

## 2018-01-09 ENCOUNTER — Encounter: Payer: Self-pay | Admitting: Family Medicine

## 2018-01-09 ENCOUNTER — Ambulatory Visit (INDEPENDENT_AMBULATORY_CARE_PROVIDER_SITE_OTHER): Payer: BLUE CROSS/BLUE SHIELD | Admitting: Family Medicine

## 2018-01-09 VITALS — BP 126/80 | HR 71 | Temp 97.6°F | Ht 69.0 in | Wt 269.8 lb

## 2018-01-09 DIAGNOSIS — E669 Obesity, unspecified: Secondary | ICD-10-CM | POA: Diagnosis not present

## 2018-01-09 DIAGNOSIS — F329 Major depressive disorder, single episode, unspecified: Secondary | ICD-10-CM | POA: Diagnosis not present

## 2018-01-09 DIAGNOSIS — J301 Allergic rhinitis due to pollen: Secondary | ICD-10-CM

## 2018-01-09 DIAGNOSIS — B20 Human immunodeficiency virus [HIV] disease: Secondary | ICD-10-CM

## 2018-01-09 DIAGNOSIS — Z1211 Encounter for screening for malignant neoplasm of colon: Secondary | ICD-10-CM | POA: Diagnosis not present

## 2018-01-09 DIAGNOSIS — F32A Depression, unspecified: Secondary | ICD-10-CM

## 2018-01-09 MED ORDER — FLUOXETINE HCL 20 MG PO CAPS: 20.0000 mg | ORAL_CAPSULE | Freq: Every day | ORAL | 1 refills | Status: DC

## 2018-01-09 NOTE — Progress Notes (Signed)
   Subjective:    Patient ID: Patrick Holmes, male    DOB: [birth year], 52 y.o.   MRN: [id number]  HPI He is here for medication management visit.  He continues on Inez and is having no difficulty with that.  He also continues on Prozac to help with his underlying psychological problems and is doing well there.  His home life is going well.  Allergies are causing no difficulty.  He is now on a keto diet and apparently has lost some inches in his waist but his weight has not changed much.  He seems to be handling this quite nicely.  Does have an underlying history of hypogonadism but is not taking medication stating it did not help much.  Otherwise he has had no fever, chills, GI complaints or pulmonary issues.   Review of Systems     Objective:   Physical Exam Alert and in no distress. Tympanic membranes and canals are normal. Pharyngeal area is normal. Neck is supple without adenopathy or thyromegaly. Cardiac exam shows a regular sinus rhythm without murmurs or gallops. Lungs are clear to auscultation.  Abdominal exam shows no masses or tenderness        Assessment & Plan:  Human immunodeficiency virus (HIV) disease (HCC) - Plan: CBC with Differential/Platelet, Comprehensive metabolic panel, HIV 1 RNA quant-no reflex-bld, Lipid panel, T-helper cells (CD4) count (not at [facility])  Depression, unspecified depression type - Plan: FLUoxetine (PROZAC) 20 MG capsule  Screening for colon cancer  Non-seasonal allergic rhinitis due to pollen  Obesity (BMI 30-39.9) - Plan: CBC with Differential/Platelet, Comprehensive metabolic panel, Lipid panel  He will continue on his present medication regimen.  Discussed diet and exercise with him and encouraged him to continue with a low carbohydrate diet which he seems to be on at the present time.  Recommend he set a goal waist size rather than weight.

## 2018-01-09 NOTE — Patient Instructions (Addendum)
Continue on his Xyzal but also consider using Flonase or Rhinocort Continue to cut back on white food

## 2018-01-10 LAB — T-HELPER CELLS (CD4) COUNT (NOT AT ARMC)
% CD 4 Pos. Lymph.: 36.5 % (ref 30.8–58.5)
Absolute CD 4 Helper: 584 /uL (ref 359–1519)
Basophils Absolute: 0 10*3/uL (ref 0.0–0.2)
Basos: 0 %
EOS (ABSOLUTE): 0.1 10*3/uL (ref 0.0–0.4)
EOS: 2 %
Hematocrit: 42.3 % (ref 37.5–51.0)
Hemoglobin: 14.6 g/dL (ref 13.0–17.7)
IMMATURE GRANULOCYTES: 0 %
Immature Grans (Abs): 0 10*3/uL (ref 0.0–0.1)
Lymphocytes Absolute: 1.6 10*3/uL (ref 0.7–3.1)
Lymphs: 49 %
MCH: 31.7 pg (ref 26.6–33.0)
MCHC: 34.5 g/dL (ref 31.5–35.7)
MCV: 92 fL (ref 79–97)
MONOCYTES: 6 %
MONOS ABS: 0.2 10*3/uL (ref 0.1–0.9)
Neutrophils Absolute: 1.5 10*3/uL (ref 1.4–7.0)
Neutrophils: 43 %
PLATELETS: 163 10*3/uL (ref 150–450)
RBC: 4.6 x10E6/uL (ref 4.14–5.80)
RDW: 14.5 % (ref 12.3–15.4)
WBC: 3.4 10*3/uL (ref 3.4–10.8)

## 2018-01-10 LAB — LIPID PANEL
Chol/HDL Ratio: 3.1 ratio (ref 0.0–5.0)
Cholesterol, Total: 156 mg/dL (ref 100–199)
HDL: 50 mg/dL (ref >39)
LDL Calculated: 86 mg/dL (ref 0–99)
Triglycerides: 99 mg/dL (ref 0–149)
VLDL Cholesterol Cal: 20 mg/dL (ref 5–40)

## 2018-01-10 LAB — HIV-1 RNA QUANT-NO REFLEX-BLD
HIV 1 RNA VIRAL LOAD LOG: 1.301 {Log_copies}/mL
HIV 1 RNA VIRAL LOAD: 20 {copies}/mL

## 2018-01-10 LAB — COMPREHENSIVE METABOLIC PANEL
ALK PHOS: 66 IU/L (ref 39–117)
ALT: 44 IU/L (ref 0–44)
AST: 18 IU/L (ref 0–40)
Albumin/Globulin Ratio: 2 (ref 1.2–2.2)
Albumin: 4.3 g/dL (ref 3.5–5.5)
BUN/Creatinine Ratio: 11 (ref 9–20)
BUN: 9 mg/dL (ref 6–24)
Bilirubin Total: 1 mg/dL (ref 0.0–1.2)
CO2: 23 mmol/L (ref 20–29)
CREATININE: 0.82 mg/dL (ref 0.76–1.27)
Calcium: 9.1 mg/dL (ref 8.7–10.2)
Chloride: 106 mmol/L (ref 96–106)
GFR calc Af Amer: 118 mL/min/{1.73_m2} (ref >59)
GFR calc non Af Amer: 102 mL/min/{1.73_m2} (ref >59)
GLUCOSE: 106 mg/dL — AB (ref 65–99)
Globulin, Total: 2.2 g/dL (ref 1.5–4.5)
Potassium: 3.7 mmol/L (ref 3.5–5.2)
SODIUM: 142 mmol/L (ref 134–144)
Total Protein: 6.5 g/dL (ref 6.0–8.5)

## 2018-01-16 ENCOUNTER — Other Ambulatory Visit: Payer: Self-pay

## 2018-01-16 DIAGNOSIS — F329 Major depressive disorder, single episode, unspecified: Secondary | ICD-10-CM

## 2018-01-16 DIAGNOSIS — F32A Depression, unspecified: Secondary | ICD-10-CM

## 2018-01-16 NOTE — Telephone Encounter (Signed)
Pt stated medication went to the wrong pharmacy. It should not be specialty. Pharmacy has been changed. Please resend medication.

## 2018-01-17 MED ORDER — FLUOXETINE HCL 20 MG PO CAPS: 20.0000 mg | ORAL_CAPSULE | Freq: Every day | ORAL | 3 refills | Status: DC

## 2018-05-17 ENCOUNTER — Telehealth: Payer: Self-pay

## 2018-05-17 NOTE — Telephone Encounter (Signed)
Pharmacy faxed over a form stating that pt could not be reached. Called pt to advise and he informed me that he has indeed got the script and that he would contact the pharmacy. KH

## 2018-07-06 ENCOUNTER — Other Ambulatory Visit: Payer: Self-pay | Admitting: Family Medicine

## 2018-07-06 DIAGNOSIS — B20 Human immunodeficiency virus [HIV] disease: Secondary | ICD-10-CM

## 2018-07-06 NOTE — Telephone Encounter (Signed)
LVM for pt to called the office and make an appointment. KH

## 2018-07-06 NOTE — Telephone Encounter (Signed)
[facility] is requesting to fill pt [facility]vy. [facility]

## 2018-07-06 NOTE — Telephone Encounter (Signed)
Needs office visit.

## 2018-08-03 ENCOUNTER — Other Ambulatory Visit: Payer: Self-pay | Admitting: Family Medicine

## 2018-08-03 ENCOUNTER — Telehealth: Payer: Self-pay

## 2018-08-03 DIAGNOSIS — F32A Depression, unspecified: Secondary | ICD-10-CM

## 2018-08-03 DIAGNOSIS — F329 Major depressive disorder, single episode, unspecified: Secondary | ICD-10-CM

## 2018-08-03 NOTE — Telephone Encounter (Signed)
[facility] requesting to [facility] pt [facility]ac. [facility]

## 2018-08-03 NOTE — Telephone Encounter (Signed)
lvm that pt needs a med check . KH

## 2018-08-03 NOTE — Telephone Encounter (Signed)
He needs an appointment

## 2018-08-03 NOTE — Telephone Encounter (Signed)
LVM for pt to call back for a med check appt. KH

## 2018-08-04 ENCOUNTER — Other Ambulatory Visit: Payer: Self-pay | Admitting: Family Medicine

## 2018-08-04 DIAGNOSIS — B20 Human immunodeficiency virus [HIV] disease: Secondary | ICD-10-CM

## 2018-08-06 NOTE — Telephone Encounter (Signed)
[facility] is requesting to fill pt Biktarvy. Please advise KH 

## 2018-08-06 NOTE — Telephone Encounter (Signed)
Pt called back and sched Med check in January 2020.

## 2018-08-06 NOTE — Telephone Encounter (Signed)
He needs a follow-up appointment

## 2018-08-06 NOTE — Telephone Encounter (Signed)
lvm for pt to call back and make an appointment. KH

## 2018-09-03 ENCOUNTER — Other Ambulatory Visit: Payer: Self-pay | Admitting: Family Medicine

## 2018-09-03 DIAGNOSIS — B20 Human immunodeficiency virus [HIV] disease: Secondary | ICD-10-CM

## 2018-09-03 NOTE — Telephone Encounter (Signed)
[facility] Bik[facility]vy. Please advise [facility]

## 2018-09-05 ENCOUNTER — Encounter: Payer: Self-pay | Admitting: Family Medicine

## 2018-09-05 ENCOUNTER — Ambulatory Visit (INDEPENDENT_AMBULATORY_CARE_PROVIDER_SITE_OTHER): Payer: Managed Care, Other (non HMO) | Admitting: Family Medicine

## 2018-09-05 VITALS — BP 132/84 | HR 76 | Temp 98.3°F | Wt 276.0 lb

## 2018-09-05 DIAGNOSIS — F329 Major depressive disorder, single episode, unspecified: Secondary | ICD-10-CM

## 2018-09-05 DIAGNOSIS — Z1211 Encounter for screening for malignant neoplasm of colon: Secondary | ICD-10-CM | POA: Diagnosis not present

## 2018-09-05 DIAGNOSIS — Z23 Encounter for immunization: Secondary | ICD-10-CM | POA: Diagnosis not present

## 2018-09-05 DIAGNOSIS — J301 Allergic rhinitis due to pollen: Secondary | ICD-10-CM

## 2018-09-05 DIAGNOSIS — B20 Human immunodeficiency virus [HIV] disease: Secondary | ICD-10-CM

## 2018-09-05 DIAGNOSIS — F32A Depression, unspecified: Secondary | ICD-10-CM

## 2018-09-05 DIAGNOSIS — E669 Obesity, unspecified: Secondary | ICD-10-CM | POA: Diagnosis not present

## 2018-09-05 NOTE — Progress Notes (Signed)
   Subjective:    Patient ID: Patrick Holmes, male    DOB: August 28, 1965, 53 y.o.   MRN: [id number]  HPI He is here for an interval evaluation.  He continues on [place] and is having no difficulty with that.  He is allergies seem to be under good control.  He continues on Prozac.  He has been on that medication since his original diagnosis of HIV.  Psychologically he feels very stable.  His work and home life are going quite well.  He does note that he has been slowly gaining weight since being placed on his Prozac.  He has no complaints of fever, chills, cough, congestion.  Does not smoke and rarely drinks.   Review of Systems     Objective:   Physical Exam Alert and in no distress. Tympanic membranes and canals are normal. Pharyngeal area is normal. Neck is supple without adenopathy or thyromegaly. Cardiac exam shows a regular sinus rhythm without murmurs or gallops. Lungs are clear to auscultation. Abdominal exam shows no hepatosplenomegaly, masses or tenderness.      Assessment & Plan:  Human immunodeficiency virus (HIV) disease (HCC) - Plan: CBC with Differential/Platelet, Comprehensive metabolic panel, Lipid panel, T-helper cells (CD4) count (not at [facility]), HIV-1 RNA quant-no reflex-bld  Depression, unspecified depression type  Screening for colon cancer - Plan: Cologuard  Obesity (BMI 30-39.9) - Plan: CBC with Differential/Platelet, Comprehensive metabolic panel, Lipid panel  Non-seasonal allergic rhinitis due to pollen  Need for influenza vaccination - Plan: Flu Vaccine QUAD 6+ mos PF IM (Fluarix Quad PF) He will continue on his present medication regimen. Discussed stopping the Prozac but recommend that he stop it in the spring.  Discussed the fact that he might note more emotional swings but unless he goes downhill, do not worry about that. Cologuard was ordered. I then discussed diet and exercise with him.  He gets a fair amount of exercise walking at work.  Recommend that he pay  more attention to preparing lunches to take with him to work which that seems to be the biggest issue.  Discussed other options including medication but at this point we will attempt to make this change.  Recommend he set a goal waist size of roughly 34 but this should be obtained over a much longer period of time

## 2018-09-07 ENCOUNTER — Telehealth: Payer: Self-pay | Admitting: Family Medicine

## 2018-09-07 DIAGNOSIS — B20 Human immunodeficiency virus [HIV] disease: Secondary | ICD-10-CM

## 2018-09-07 LAB — COMPREHENSIVE METABOLIC PANEL
A/G RATIO: 1.9 (ref 1.2–2.2)
ALT: 44 IU/L (ref 0–44)
AST: 20 IU/L (ref 0–40)
Albumin: 4.3 g/dL (ref 3.5–5.5)
Alkaline Phosphatase: 76 IU/L (ref 39–117)
BUN/Creatinine Ratio: 9 (ref 9–20)
BUN: 8 mg/dL (ref 6–24)
Bilirubin Total: 1.4 mg/dL — ABNORMAL HIGH (ref 0.0–1.2)
CHLORIDE: 106 mmol/L (ref 96–106)
CO2: 24 mmol/L (ref 20–29)
Calcium: 9.2 mg/dL (ref 8.7–10.2)
Creatinine, Ser: 0.93 mg/dL (ref 0.76–1.27)
GFR calc non Af Amer: 95 mL/min/{1.73_m2} (ref >59)
GFR, EST AFRICAN AMERICAN: 109 mL/min/{1.73_m2} (ref >59)
GLOBULIN, TOTAL: 2.3 g/dL (ref 1.5–4.5)
Glucose: 92 mg/dL (ref 65–99)
POTASSIUM: 4 mmol/L (ref 3.5–5.2)
SODIUM: 144 mmol/L (ref 134–144)
TOTAL PROTEIN: 6.6 g/dL (ref 6.0–8.5)

## 2018-09-07 LAB — T-HELPER CELLS (CD4) COUNT (NOT AT ARMC)
% CD 4 POS. LYMPH.: 36.4 % (ref 30.8–58.5)
ABSOLUTE CD 4 HELPER: 764 /uL (ref 359–1519)
Basophils Absolute: 0 10*3/uL (ref 0.0–0.2)
Basos: 0 %
EOS (ABSOLUTE): 0.1 10*3/uL (ref 0.0–0.4)
Eos: 2 %
HEMATOCRIT: 41 % (ref 37.5–51.0)
HEMOGLOBIN: 14.4 g/dL (ref 13.0–17.7)
IMMATURE GRANULOCYTES: 0 %
Immature Grans (Abs): 0 10*3/uL (ref 0.0–0.1)
LYMPHS ABS: 2.1 10*3/uL (ref 0.7–3.1)
Lymphs: 45 %
MCH: 32.7 pg (ref 26.6–33.0)
MCHC: 35.1 g/dL (ref 31.5–35.7)
MCV: 93 fL (ref 79–97)
MONOS ABS: 0.4 10*3/uL (ref 0.1–0.9)
Monocytes: 8 %
NEUTROS PCT: 45 %
Neutrophils Absolute: 2.1 10*3/uL (ref 1.4–7.0)
Platelets: 175 10*3/uL (ref 150–450)
RBC: 4.41 x10E6/uL (ref 4.14–5.80)
RDW: 14.2 % (ref 11.6–15.4)
WBC: 4.6 10*3/uL (ref 3.4–10.8)

## 2018-09-07 LAB — LIPID PANEL
Chol/HDL Ratio: 3 ratio (ref 0.0–5.0)
Cholesterol, Total: 149 mg/dL (ref 100–199)
HDL: 50 mg/dL (ref >39)
LDL Calculated: 82 mg/dL (ref 0–99)
Triglycerides: 85 mg/dL (ref 0–149)
VLDL Cholesterol Cal: 17 mg/dL (ref 5–40)

## 2018-09-07 LAB — HIV-1 RNA QUANT-NO REFLEX-BLD
HIV-1 RNA Viral Load Log: 1.699 log10copy/mL
HIV-1 RNA Viral Load: 50 copies/mL

## 2018-09-07 MED ORDER — BICTEGRAVIR-EMTRICITAB-TENOFOV 50-200-25 MG PO TABS: 1.0000 | ORAL_TABLET | Freq: Every day | ORAL | 3 refills | Status: DC

## 2018-09-07 NOTE — Addendum Note (Signed)
Addended by: Ronnald Nian on: 09/07/2018 10:58 AM   Modules accepted: Orders

## 2018-09-07 NOTE — Telephone Encounter (Signed)
Recv'd fax from [facility] that they are not contracted provider for [facility], pt must use [facility] site store # [phone] [facility].  I added this pharmacy to his pharmacy list.  Please resend Jolly Mango to this pharmacy

## 2018-09-09 NOTE — Addendum Note (Signed)
Addended by: Ronnald Nian on: 09/09/2018 09:05 AM   Modules accepted: Orders

## 2018-11-08 ENCOUNTER — Telehealth: Payer: Self-pay

## 2018-11-08 NOTE — Telephone Encounter (Signed)
Pt was advised to complete the cologuard kit and send it back in. [facility]

## 2018-11-21 ENCOUNTER — Other Ambulatory Visit: Payer: Self-pay

## 2019-09-09 ENCOUNTER — Other Ambulatory Visit: Payer: Self-pay | Admitting: Family Medicine

## 2019-09-09 DIAGNOSIS — Z1211 Encounter for screening for malignant neoplasm of colon: Secondary | ICD-10-CM

## 2019-09-18 ENCOUNTER — Other Ambulatory Visit: Payer: Self-pay | Admitting: Family Medicine

## 2019-09-18 DIAGNOSIS — B20 Human immunodeficiency virus [HIV] disease: Secondary | ICD-10-CM

## 2019-09-18 MED ORDER — BIKTARVY 50-200-25 MG PO TABS: 1.0000 | ORAL_TABLET | Freq: Every day | ORAL | 0 refills | Status: DC

## 2019-09-18 NOTE — Telephone Encounter (Signed)
He needs an appointment

## 2019-09-18 NOTE — Telephone Encounter (Signed)
Lvm for pt to call back and schedule an appointment for med check. KH

## 2019-09-18 NOTE — Telephone Encounter (Signed)
[facility] is requesting to fill biktarvy . Please advise [facility]

## 2019-09-19 ENCOUNTER — Other Ambulatory Visit: Payer: Self-pay | Admitting: Family Medicine

## 2019-09-19 DIAGNOSIS — B20 Human immunodeficiency virus [HIV] disease: Secondary | ICD-10-CM

## 2019-09-23 ENCOUNTER — Telehealth: Payer: Self-pay | Admitting: Family Medicine

## 2019-09-23 DIAGNOSIS — B20 Human immunodeficiency virus [HIV] disease: Secondary | ICD-10-CM

## 2019-09-23 MED ORDER — BIKTARVY 50-200-25 MG PO TABS: 1.0000 | ORAL_TABLET | Freq: Every day | ORAL | 0 refills | Status: DC

## 2019-09-23 NOTE — Telephone Encounter (Signed)
Pharmacy called and biktarvy was suppose to be called in to the [facility] pharmacy not the [facility] - [place], [place] - [address] please resend

## 2019-09-23 NOTE — Telephone Encounter (Signed)
Please resend pt biktarvy to [facility] specialty pharmacy. Thanks [employer]

## 2019-09-27 ENCOUNTER — Telehealth: Payer: Self-pay

## 2019-09-27 NOTE — Telephone Encounter (Signed)
I received a fax from the pts. Pharmacy stating he needed a PA for his Biktarvy I submitted one online on Cover my Meds and per her insurance company medication is covered under current plan and no PA is required at this time.

## 2019-10-02 ENCOUNTER — Ambulatory Visit (INDEPENDENT_AMBULATORY_CARE_PROVIDER_SITE_OTHER): Payer: Managed Care, Other (non HMO) | Admitting: Family Medicine

## 2019-10-02 ENCOUNTER — Encounter: Payer: Self-pay | Admitting: Family Medicine

## 2019-10-02 ENCOUNTER — Other Ambulatory Visit: Payer: Self-pay

## 2019-10-02 VITALS — BP 148/94 | HR 75 | Temp 96.2°F | Wt 272.4 lb

## 2019-10-02 DIAGNOSIS — F32A Depression, unspecified: Secondary | ICD-10-CM

## 2019-10-02 DIAGNOSIS — B20 Human immunodeficiency virus [HIV] disease: Secondary | ICD-10-CM | POA: Diagnosis not present

## 2019-10-02 DIAGNOSIS — E291 Testicular hypofunction: Secondary | ICD-10-CM

## 2019-10-02 DIAGNOSIS — Z23 Encounter for immunization: Secondary | ICD-10-CM | POA: Diagnosis not present

## 2019-10-02 DIAGNOSIS — Z1211 Encounter for screening for malignant neoplasm of colon: Secondary | ICD-10-CM | POA: Diagnosis not present

## 2019-10-02 DIAGNOSIS — J301 Allergic rhinitis due to pollen: Secondary | ICD-10-CM

## 2019-10-02 DIAGNOSIS — F329 Major depressive disorder, single episode, unspecified: Secondary | ICD-10-CM

## 2019-10-02 DIAGNOSIS — E669 Obesity, unspecified: Secondary | ICD-10-CM

## 2019-10-02 DIAGNOSIS — R030 Elevated blood-pressure reading, without diagnosis of hypertension: Secondary | ICD-10-CM

## 2019-10-02 MED ORDER — BIKTARVY 50-200-25 MG PO TABS: 1.0000 | ORAL_TABLET | Freq: Every day | ORAL | 3 refills | Status: DC

## 2019-10-02 MED ORDER — FLUOXETINE HCL 20 MG PO CAPS: 20.0000 mg | ORAL_CAPSULE | Freq: Every day | ORAL | 3 refills | Status: DC

## 2019-10-02 NOTE — Progress Notes (Signed)
   Subjective:    Patient ID: Patrick Holmes, male    DOB: [birth year], 54 y.o.   MRN: [id number]  HPI He is here for an interval evaluation.  He has no particular concerns or complaints.  He continues on [place] and is having no difficulty with that.  He is also taking Prozac and wants to stay on this as it does help keep him stable psychologically.  His allergies are causing no difficulty.  Does have a previous history of hypogonadism however the cost of medication kept him from pursuing that further.  He did get the Cologuard but has yet to turn in.  His work and home life are going well.  He is in a stable 25-year relationship.  Family and social history as well as health maintenance and immunizations was reviewed.   Review of Systems     Objective:   Physical Exam Alert and in no distress. Tympanic membranes and canals are normal. Pharyngeal area is normal. Neck is supple without adenopathy or thyromegaly. Cardiac exam shows a regular sinus rhythm without murmurs or gallops. Lungs are clear to auscultation. Abdominal exam shows no masses or tenderness.       Assessment & Plan:  Human immunodeficiency virus (HIV) disease (HCC) - Plan: CBC with Differential/Platelet, Comprehensive metabolic panel, Lipid panel, T-helper cells (CD4) count (not at [facility]), HIV-1 RNA quant-no reflex-bld, bictegravir-emtricitabine-tenofovir AF (BIKTARVY) 50-200-25 MG TABS tablet  Screening for colon cancer  Depression, unspecified depression type - Plan: FLUoxetine (PROZAC) 20 MG capsule  Obesity (BMI 30-39.9) - Plan: CBC with Differential/Platelet, Comprehensive metabolic panel, Lipid panel  Non-seasonal allergic rhinitis due to pollen  Need for influenza vaccination - Plan: Flu Vaccine QUAD 6+ mos PF IM (Fluarix Quad PF)  Hypogonadism male - Plan: Testosterone  Elevated blood pressure reading He will continue on his present medication regimen.  Encouraged him to get involved in a regular exercise program  of walking.  He is to return here in 1 month for a recheck on his blood pressure.  Encouraged him to go ahead and get the Cologuard done.

## 2019-10-03 LAB — T-HELPER CELLS (CD4) COUNT (NOT AT ARMC)
% CD 4 Pos. Lymph.: 37.2 % (ref 30.8–58.5)
Absolute CD 4 Helper: 707 /uL (ref 359–1519)
Basophils Absolute: 0 10*3/uL (ref 0.0–0.2)
Basos: 1 %
EOS (ABSOLUTE): 0.1 10*3/uL (ref 0.0–0.4)
Eos: 2 %
Hematocrit: 42.8 % (ref 37.5–51.0)
Hemoglobin: 14.9 g/dL (ref 13.0–17.7)
Immature Grans (Abs): 0 10*3/uL (ref 0.0–0.1)
Immature Granulocytes: 0 %
Lymphocytes Absolute: 1.9 10*3/uL (ref 0.7–3.1)
Lymphs: 40 %
MCH: 32.7 pg (ref 26.6–33.0)
MCHC: 34.8 g/dL (ref 31.5–35.7)
MCV: 94 fL (ref 79–97)
Monocytes Absolute: 0.4 10*3/uL (ref 0.1–0.9)
Monocytes: 8 %
Neutrophils Absolute: 2.4 10*3/uL (ref 1.4–7.0)
Neutrophils: 49 %
Platelets: 175 10*3/uL (ref 150–450)
RBC: 4.56 x10E6/uL (ref 4.14–5.80)
RDW: 14.5 % (ref 11.6–15.4)
WBC: 4.7 10*3/uL (ref 3.4–10.8)

## 2019-10-03 LAB — COMPREHENSIVE METABOLIC PANEL
ALT: 38 IU/L (ref 0–44)
AST: 21 IU/L (ref 0–40)
Albumin/Globulin Ratio: 1.8 (ref 1.2–2.2)
Albumin: 4.3 g/dL (ref 3.8–4.9)
Alkaline Phosphatase: 79 IU/L (ref 39–117)
BUN/Creatinine Ratio: 8 — ABNORMAL LOW (ref 9–20)
BUN: 6 mg/dL (ref 6–24)
Bilirubin Total: 1.3 mg/dL — ABNORMAL HIGH (ref 0.0–1.2)
CO2: 20 mmol/L (ref 20–29)
Calcium: 8.8 mg/dL (ref 8.7–10.2)
Chloride: 107 mmol/L — ABNORMAL HIGH (ref 96–106)
Creatinine, Ser: 0.77 mg/dL (ref 0.76–1.27)
GFR calc Af Amer: 121 mL/min/{1.73_m2} (ref >59)
GFR calc non Af Amer: 104 mL/min/{1.73_m2} (ref >59)
Globulin, Total: 2.4 g/dL (ref 1.5–4.5)
Glucose: 90 mg/dL (ref 65–99)
Potassium: 3.6 mmol/L (ref 3.5–5.2)
Sodium: 142 mmol/L (ref 134–144)
Total Protein: 6.7 g/dL (ref 6.0–8.5)

## 2019-10-03 LAB — LIPID PANEL
Chol/HDL Ratio: 3 ratio (ref 0.0–5.0)
Cholesterol, Total: 145 mg/dL (ref 100–199)
HDL: 48 mg/dL (ref >39)
LDL Chol Calc (NIH): 77 mg/dL (ref 0–99)
Triglycerides: 109 mg/dL (ref 0–149)
VLDL Cholesterol Cal: 20 mg/dL (ref 5–40)

## 2019-10-03 LAB — HIV-1 RNA QUANT-NO REFLEX-BLD: HIV-1 RNA Viral Load: <20 copies/mL

## 2019-10-03 LAB — TESTOSTERONE: Testosterone: 4 ng/dL — ABNORMAL LOW (ref 264–916)

## 2019-10-04 LAB — FOLLICLE STIMULATING HORMONE: FSH: 0.7 m[IU]/mL — ABNORMAL LOW (ref 1.5–12.4)

## 2019-10-04 LAB — PROLACTIN: Prolactin: 9.6 ng/mL (ref 4.0–15.2)

## 2019-10-04 LAB — LUTEINIZING HORMONE: LH: 0.5 m[IU]/mL — ABNORMAL LOW (ref 1.7–8.6)

## 2019-10-04 LAB — TSH: TSH: 2.53 u[IU]/mL (ref 0.450–4.500)

## 2019-10-08 ENCOUNTER — Other Ambulatory Visit: Payer: Self-pay

## 2019-10-08 NOTE — Progress Notes (Signed)
Error

## 2019-10-09 LAB — SPECIMEN STATUS REPORT

## 2019-10-11 ENCOUNTER — Telehealth: Payer: Self-pay

## 2019-10-11 LAB — TRANSFERRIN: Transferrin: 239 mg/dL (ref 177–329)

## 2019-10-11 LAB — IRON

## 2019-10-11 LAB — SPECIMEN STATUS REPORT

## 2019-10-11 NOTE — Telephone Encounter (Signed)
Need to have pt come by today for repeat labs due to lab not having enough blood. KH

## 2019-10-14 ENCOUNTER — Telehealth: Payer: Self-pay

## 2019-10-14 DIAGNOSIS — E291 Testicular hypofunction: Secondary | ICD-10-CM

## 2019-10-14 NOTE — Telephone Encounter (Signed)
Please advise of order for imaging . No order in pt chart. KH

## 2019-10-14 NOTE — Telephone Encounter (Signed)
Hopefully this will work?

## 2019-10-30 ENCOUNTER — Ambulatory Visit (INDEPENDENT_AMBULATORY_CARE_PROVIDER_SITE_OTHER): Payer: Managed Care, Other (non HMO) | Admitting: Family Medicine

## 2019-10-30 ENCOUNTER — Other Ambulatory Visit: Payer: Self-pay

## 2019-10-30 ENCOUNTER — Encounter: Payer: Self-pay | Admitting: Family Medicine

## 2019-10-30 VITALS — BP 132/88 | HR 81 | Temp 97.1°F | Wt 272.4 lb

## 2019-10-30 DIAGNOSIS — E291 Testicular hypofunction: Secondary | ICD-10-CM | POA: Diagnosis not present

## 2019-10-30 DIAGNOSIS — I10 Essential (primary) hypertension: Secondary | ICD-10-CM | POA: Diagnosis not present

## 2019-10-30 MED ORDER — HYDROCHLOROTHIAZIDE 12.5 MG PO CAPS: 12.5000 mg | ORAL_CAPSULE | Freq: Every day | ORAL | 3 refills | Status: DC

## 2019-10-30 NOTE — Patient Instructions (Signed)
Check out the DASH diet 

## 2019-10-30 NOTE — Progress Notes (Signed)
   Subjective:    Patient ID: Rayhaan Huster, male    DOB: 1966/01/01, 54 y.o.   MRN: [id number]  HPI He is here for consult concerning his blood pressure and low testosterone.  Recent blood pressures have been elevated.  Also review of record indicates his blood pressure in 2020 was also slightly above the normal range. Blood work done in January did show a very low testosterone and subsequent pituitary studies also showed low numbers.  He is scheduled for an MRI within the next couple of days.   Review of Systems     Objective:   Physical Exam Alert and in no distress.  Blood pressure is recorded.       Assessment & Plan:  Essential hypertension - Plan: hydrochlorothiazide (MICROZIDE) 12.5 MG capsule  Hypogonadism male I will follow up on his blood pressure after getting the MRI back.  I have concerns about his pituitary and possible referral from that.  He will probably have his blood pressure checked at another facility in the near future.

## 2019-11-08 ENCOUNTER — Other Ambulatory Visit: Payer: Self-pay | Admitting: Family Medicine

## 2019-11-08 DIAGNOSIS — I10 Essential (primary) hypertension: Secondary | ICD-10-CM

## 2019-11-08 MED ORDER — HYDROCHLOROTHIAZIDE 12.5 MG PO CAPS: 12.5000 mg | ORAL_CAPSULE | Freq: Every day | ORAL | 3 refills | Status: DC

## 2019-11-21 ENCOUNTER — Other Ambulatory Visit: Payer: Self-pay

## 2019-11-21 DIAGNOSIS — I10 Essential (primary) hypertension: Secondary | ICD-10-CM

## 2019-11-21 MED ORDER — HYDROCHLOROTHIAZIDE 12.5 MG PO CAPS: 12.5000 mg | ORAL_CAPSULE | Freq: Every day | ORAL | 3 refills | Status: DC

## 2019-11-25 ENCOUNTER — Ambulatory Visit
Admission: RE | Admit: 2019-11-25 | Discharge: 2019-11-25 | Disposition: A | Discharge status: Home / Self Care | Payer: Managed Care, Other (non HMO) | Source: Ambulatory Visit | Attending: Family Medicine | Admitting: Family Medicine

## 2019-11-25 DIAGNOSIS — E291 Testicular hypofunction: Secondary | ICD-10-CM

## 2019-11-25 MED ORDER — GADOBENATE DIMEGLUMINE 529 MG/ML IV SOLN
10.0000 mL | Freq: Once | INTRAVENOUS | Status: AC | PRN
  Administered 2019-11-25: 10 mL via INTRAVENOUS

## 2019-11-27 ENCOUNTER — Other Ambulatory Visit: Payer: Self-pay

## 2019-11-27 DIAGNOSIS — E237 Disorder of pituitary gland, unspecified: Secondary | ICD-10-CM

## 2019-12-05 ENCOUNTER — Telehealth: Payer: Self-pay

## 2019-12-05 NOTE — Telephone Encounter (Signed)
Done and pt was advised KH 

## 2019-12-05 NOTE — Telephone Encounter (Signed)
Explained that there is a questionable lesion in his pituitary

## 2019-12-05 NOTE — Telephone Encounter (Signed)
Pt was advise to submit his cologuard he would also like to know his results concerning recent imaging. Pt was advised that we are reerring him to Neuro but no other no was given. Please advise of result note. Thanks [employer]

## 2019-12-17 ENCOUNTER — Telehealth: Payer: Self-pay

## 2019-12-17 NOTE — Telephone Encounter (Signed)
Dr.Cabell called and requested mutual pt lab resulrs. Labe were routed over to provider. KH

## 2019-12-23 ENCOUNTER — Telehealth: Payer: Self-pay

## 2019-12-23 NOTE — Telephone Encounter (Signed)
Called pt to ind out he is doing and to confirm he has an appt to follow up with NeuroSurgery and spine.  No answer but lvm. KH

## 2019-12-24 LAB — COLOGUARD
COLOGUARD: NEGATIVE
Cologuard: NEGATIVE

## 2019-12-25 ENCOUNTER — Encounter: Payer: Self-pay | Admitting: Family Medicine

## 2020-01-08 ENCOUNTER — Other Ambulatory Visit: Payer: Self-pay

## 2020-01-08 ENCOUNTER — Ambulatory Visit (INDEPENDENT_AMBULATORY_CARE_PROVIDER_SITE_OTHER): Payer: Managed Care, Other (non HMO) | Admitting: Family Medicine

## 2020-01-08 ENCOUNTER — Encounter: Payer: Self-pay | Admitting: Family Medicine

## 2020-01-08 VITALS — BP 142/92 | HR 79 | Temp 96.8°F | Wt 267.6 lb

## 2020-01-08 DIAGNOSIS — I10 Essential (primary) hypertension: Secondary | ICD-10-CM | POA: Diagnosis not present

## 2020-01-08 DIAGNOSIS — E291 Testicular hypofunction: Secondary | ICD-10-CM | POA: Diagnosis not present

## 2020-01-08 DIAGNOSIS — E237 Disorder of pituitary gland, unspecified: Secondary | ICD-10-CM

## 2020-01-08 MED ORDER — TESTOSTERONE 20.25 MG/1.25GM (1.62%) TD GEL: 2.0000 | Freq: Every day | TRANSDERMAL | 5 refills | Status: DC

## 2020-01-08 MED ORDER — HYDROCHLOROTHIAZIDE 12.5 MG PO CAPS: 12.5000 mg | ORAL_CAPSULE | Freq: Every day | ORAL | 3 refills | Status: DC

## 2020-01-08 NOTE — Progress Notes (Signed)
   Subjective:    Patient ID: Patrick Holmes, male    DOB: 07-24-1966, 54 y.o.   MRN: [id number]  HPI He is here for an interval evaluation.  He was recently seen by neurosurgery.  That note was reviewed with him.  He is scheduled for a follow-up MRI to ensure no further issues with his pituitary.  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 He has also become more physically active and has lost several pounds.   Review of Systems     Objective:   Physical Exam Alert and in no distress.  Blood pressure and weight are recorded.      Assessment & Plan:  Hypogonadism male - Plan: Testosterone 20.25 MG/1.25GM (1.62%) GEL  Essential hypertension - Plan: hydrochlorothiazide (MICROZIDE) 12.5 MG capsule  Lesion of pituitary gland (HCC) I will go ahead and place him on testosterone.  He is to use 2 pumps per day and return here in 1 month for a recheck. I will also place him on HCTZ.  Discussed possible side effects of this medication and will recheck the blood pressure in 1 month.

## 2020-01-09 ENCOUNTER — Telehealth: Payer: Self-pay

## 2020-01-09 NOTE — Telephone Encounter (Signed)
I submitted a PA for the pts. Testosterone Gel and it was approved form 01/09/20-01/08/21.

## 2020-02-12 ENCOUNTER — Other Ambulatory Visit: Payer: Self-pay

## 2020-02-12 ENCOUNTER — Ambulatory Visit (INDEPENDENT_AMBULATORY_CARE_PROVIDER_SITE_OTHER): Payer: Managed Care, Other (non HMO) | Admitting: Family Medicine

## 2020-02-12 ENCOUNTER — Encounter: Payer: Self-pay | Admitting: Family Medicine

## 2020-02-12 VITALS — BP 130/86 | HR 82 | Temp 96.4°F | Wt 269.8 lb

## 2020-02-12 DIAGNOSIS — I10 Essential (primary) hypertension: Secondary | ICD-10-CM | POA: Diagnosis not present

## 2020-02-12 DIAGNOSIS — E291 Testicular hypofunction: Secondary | ICD-10-CM | POA: Diagnosis not present

## 2020-02-12 NOTE — Progress Notes (Signed)
   Subjective:    Patient ID: Patrick Holmes, male    DOB: 03-Jan-1966, 54 y.o.   MRN: [id number]  HPI He is here for recheck on his testosterone.  He has been using this for over a month and has noted an increase in his energy as well as stamina and libido.  He is happy with this. He also is on blood pressure medication.  He is having no difficulty with that. Review of Systems     Objective:   Physical Exam Alert and in no distress.  Blood pressure is recorded.       Assessment & Plan:  Essential hypertension  Hypogonadism male - Plan: Testosterone I will continue to monitor her his blood pressure.  Now that he is more physically active, this might help bring his blood pressure down.

## 2020-02-13 ENCOUNTER — Telehealth: Payer: Self-pay

## 2020-02-13 LAB — TESTOSTERONE: Testosterone: 218 ng/dL — ABNORMAL LOW (ref 264–916)

## 2020-02-13 MED ORDER — TESTOSTERONE 20.25 MG/1.25GM (1.62%) TD GEL: 3.0000 | Freq: Every day | TRANSDERMAL | 5 refills | Status: DC

## 2020-02-13 NOTE — Telephone Encounter (Signed)
Pts. Pharmacy called [facility] where you sent his testosterone into. They need clarification on the pts. Testosterone prescription it says 3 pumps but it says packets not a pump. So they just needed to know if he is supposed to get a pump or packets and the quantity of that and directions of use. If you could send in a new prescription or call to clarify.

## 2020-02-13 NOTE — Addendum Note (Signed)
Addended by: Ronnald Nian on: 02/13/2020 12:09 PM   Modules accepted: Orders

## 2020-02-14 NOTE — Telephone Encounter (Signed)
It is supposed to be the pump and should be 3 pumps.  The system sometimes screws things up

## 2020-02-14 NOTE — Telephone Encounter (Signed)
I called the pts. Pharmacy and got it switched to the pump.

## 2020-02-17 ENCOUNTER — Other Ambulatory Visit: Payer: Self-pay

## 2020-02-17 DIAGNOSIS — E291 Testicular hypofunction: Secondary | ICD-10-CM

## 2020-02-17 MED ORDER — TESTOSTERONE 20.25 MG/1.25GM (1.62%) TD GEL: 3.0000 | Freq: Every day | TRANSDERMAL | 5 refills | Status: DC

## 2020-02-17 NOTE — Addendum Note (Signed)
Addended by: Ronnald Nian on: 02/17/2020 04:28 PM   Modules accepted: Orders

## 2020-03-18 ENCOUNTER — Other Ambulatory Visit: Payer: Self-pay

## 2020-04-06 ENCOUNTER — Encounter: Payer: Self-pay | Admitting: Family Medicine

## 2020-04-06 ENCOUNTER — Other Ambulatory Visit: Payer: Self-pay

## 2020-04-06 ENCOUNTER — Ambulatory Visit (INDEPENDENT_AMBULATORY_CARE_PROVIDER_SITE_OTHER): Payer: Managed Care, Other (non HMO) | Admitting: Family Medicine

## 2020-04-06 VITALS — BP 168/100 | HR 67 | Temp 97.8°F | Wt 276.2 lb

## 2020-04-06 DIAGNOSIS — E291 Testicular hypofunction: Secondary | ICD-10-CM

## 2020-04-06 NOTE — Progress Notes (Signed)
   Subjective:    Patient ID: Patrick Holmes, male    DOB: [birth year], 54 y.o.   MRN: [id number]  HPI He is here for recheck on testosterone.  He is now using 3 pumps daily and has noted a definite increase in energy and libido.  He is happy with the results.   Review of Systems     Objective:   Physical Exam Alert and in no distress otherwise not examined       Assessment & Plan:  Hypogonadism male - Plan: Testosterone Hopefully the testosterone level will also mirror the clinical symptoms that he is feeling.

## 2020-04-07 LAB — TESTOSTERONE: Testosterone: 454 ng/dL (ref 264–916)

## 2020-04-11 IMAGING — MR MR HEAD WO/W CM
19 series · 48 of 48 positions shown · IV contrast (multihance)
Comparison: None.

CLINICAL DATA: Male hypogonadism.

EXAM:
MRI HEAD WITHOUT AND WITH CONTRAST
TECHNIQUE: Multiplanar, multiecho pulse sequences of the brain and surrounding
structures were obtained without and with intravenous contrast.
CONTRAST:  10mL MULTIHANCE GADOBENATE DIMEGLUMINE 529 MG/ML IV SOLN

[Series 5: T1 · sagittal · 4.0mm · 0.90mm/px · 1 of 29 slices shown (1 of 5)]
[im 1/29]
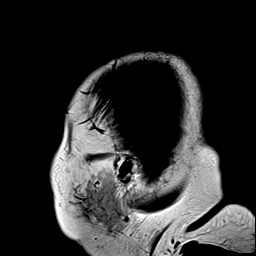

[Series 6: DWI · axial · 3.0mm · 1.44mm/px · z∈[-108,+43]mm · 7 of 84 slices shown (1 of 2)]
[im 1/84]
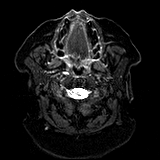
[im 14/84]
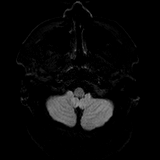
[im 28/84]
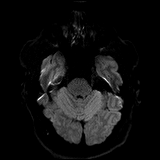
[im 42/84]
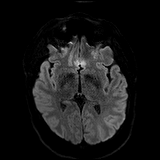
[im 56/84]
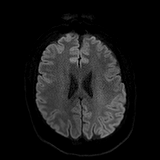
[im 70/84]
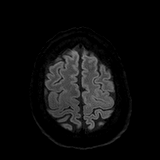
[im 84/84]
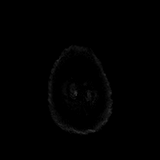

[Series 7: DWI · axial · 3.0mm · 1.44mm/px · z∈[-108,+43]mm · 3 of 42 slices shown (2 of 2)]
[im 1/42]
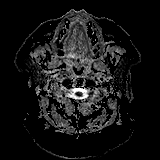
[im 21/42]
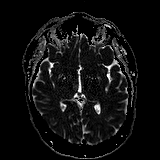
[im 42/42]
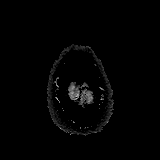

[Series 8: T2 · axial · 4.0mm · 0.36mm/px · z∈[-90,+42]mm · 2 of 29 slices shown]
[im 1/29]
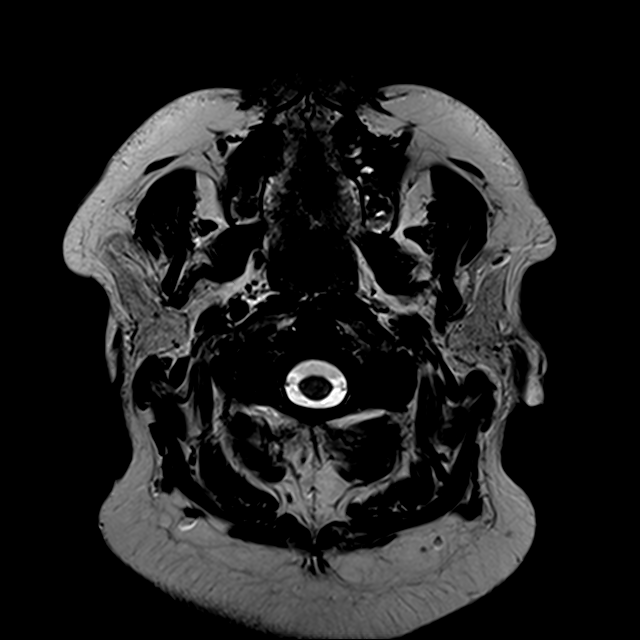
[im 29/29]
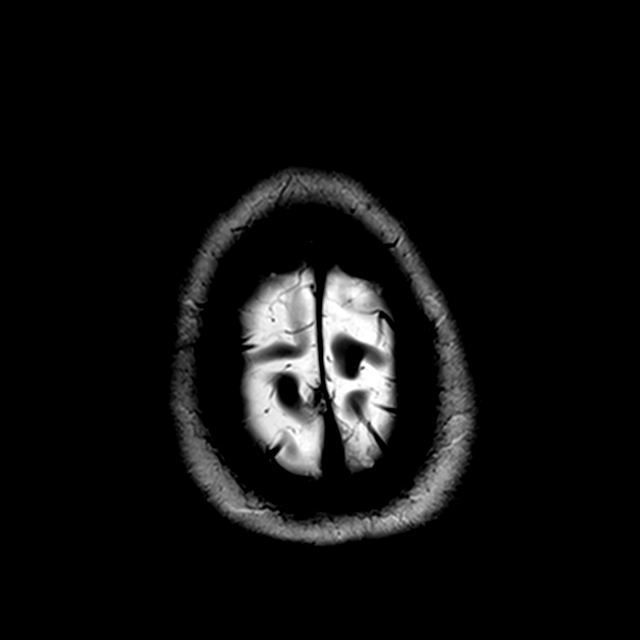

[Series 10: swi_images · axial · 1.5mm · 0.90mm/px · z∈[-87,+43]mm · 8 of 96 slices shown]
[im 1/96]
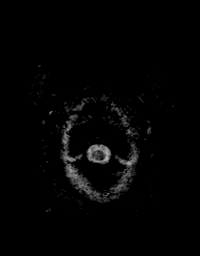
[im 14/96]
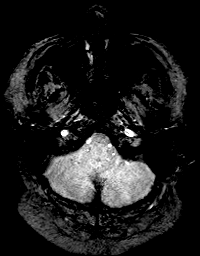
[im 28/96]
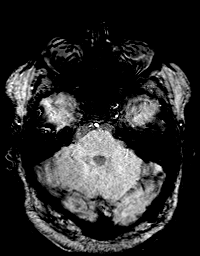
[im 41/96]
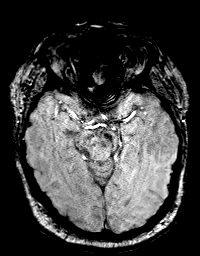
[im 55/96]
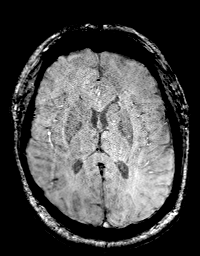
[im 68/96]
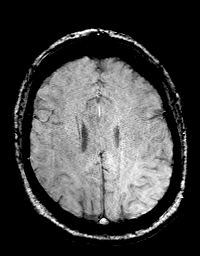
[im 82/96]
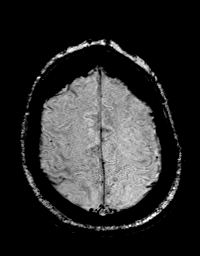
[im 96/96]
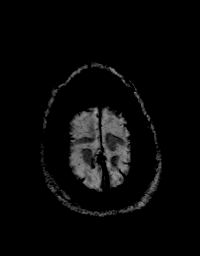

[Series 11: FLAIR · axial · 3.0mm · 0.72mm/px · z∈[-90,+42]mm · 2 of 25 slices shown]
[im 1/25]
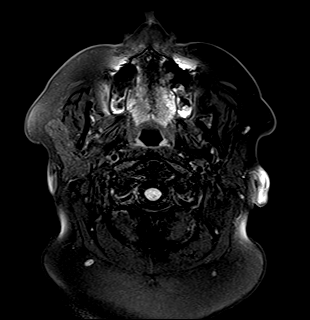
[im 25/25]
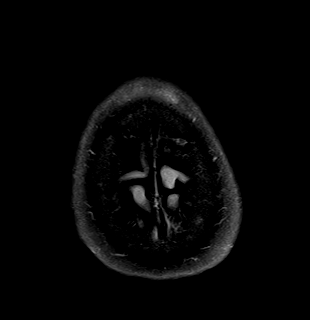

[Series 12: T1 · sagittal · 3.0mm · 0.50mm/px · 1 of 13 slices shown (2 of 5)]
[im 1/13]
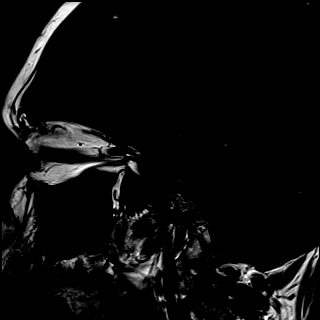

[Series 13: T1 · coronal · 3.0mm · 0.50mm/px · 1 of 13 slices shown (3 of 5)]
[im 1/13]
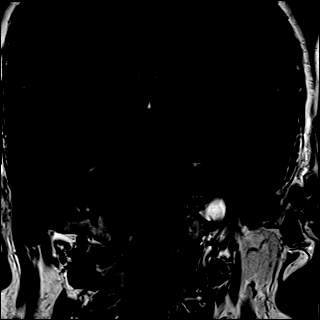

[Series 14: T1 · coronal · non-contrast · 3.0mm · 0.62mm/px · 1 of 13 slices shown (4 of 5)]
[im 1/13]
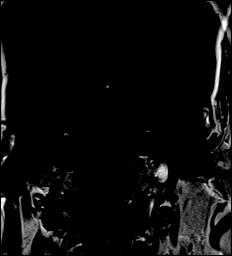

[Series 15: cor post dyn · coronal · 3.0mm · 0.62mm/px · 1 of 9 slices shown (1 of 6)]
[im 1/9]
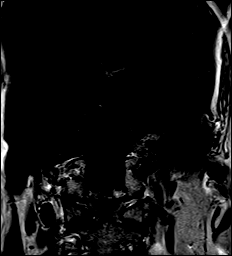

[Series 16: cor post dyn · coronal · 3.0mm · 0.62mm/px · 1 of 9 slices shown (2 of 6)]
[im 1/9]
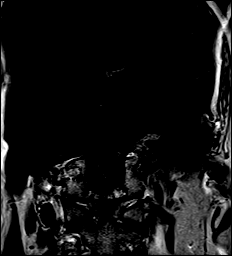

[Series 17: cor post dyn · coronal · 3.0mm · 0.62mm/px · 1 of 9 slices shown (3 of 6)]
[im 1/9]
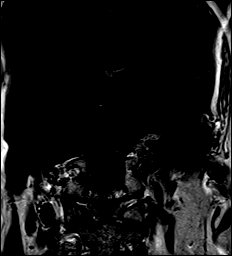

[Series 18: cor post dyn · coronal · 3.0mm · 0.62mm/px · 1 of 9 slices shown (4 of 6)]
[im 1/9]
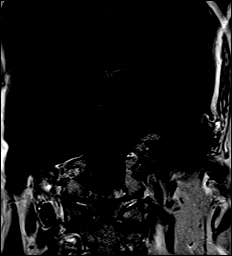

[Series 19: cor post dyn · coronal · 3.0mm · 0.62mm/px · 1 of 9 slices shown (5 of 6)]
[im 1/9]
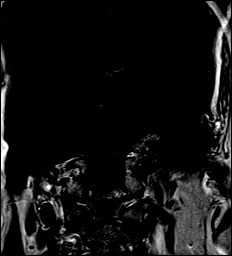

[Series 20: cor post dyn · coronal · 3.0mm · 0.62mm/px · 1 of 9 slices shown (6 of 6)]
[im 1/9]
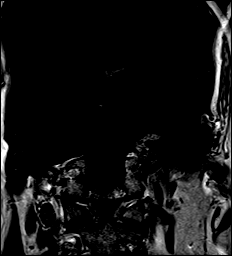

[Series 21: T1 post-contrast · sagittal · 3.0mm · 0.50mm/px · 1 of 13 slices shown (1 of 3)]
[im 1/13]
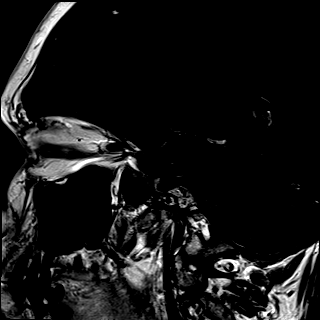

[Series 22: T1 post-contrast · coronal · 3.0mm · 0.50mm/px · 1 of 13 slices shown (2 of 3)]
[im 1/13]
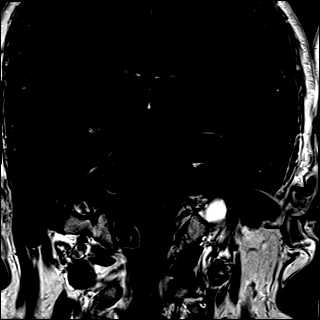

[Series 23: T1 · axial · 1.0mm · 0.86mm/px · z∈[-90,+41]mm · 12 of 144 slices shown (5 of 5)]
[im 1/144]
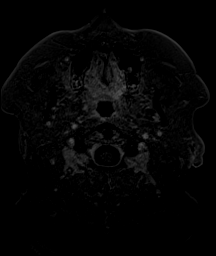
[im 14/144]
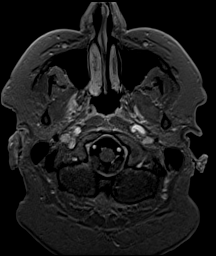
[im 27/144]
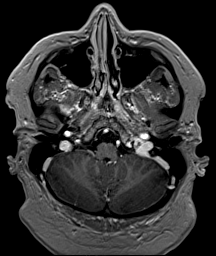
[im 40/144]
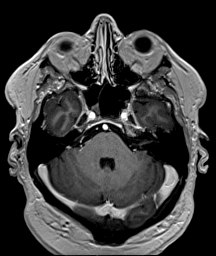
[im 53/144]
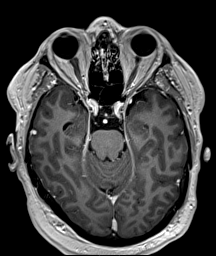
[im 66/144]
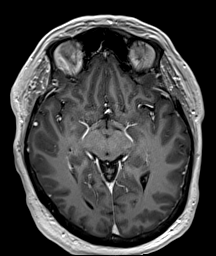
[im 79/144]
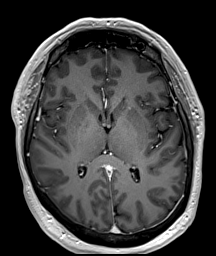
[im 92/144]
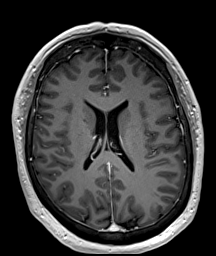
[im 105/144]
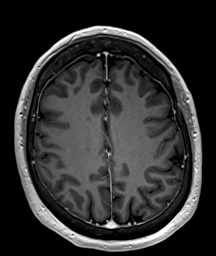
[im 118/144]
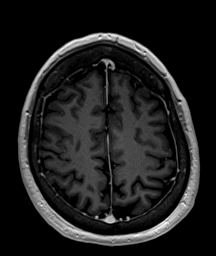
[im 131/144]
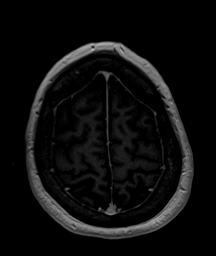
[im 144/144]
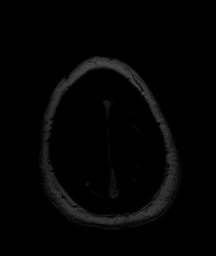

[Series 24: T1 post-contrast · coronal · 4.0mm · 0.47mm/px · 2 of 30 slices shown (3 of 3)]
[im 1/30]
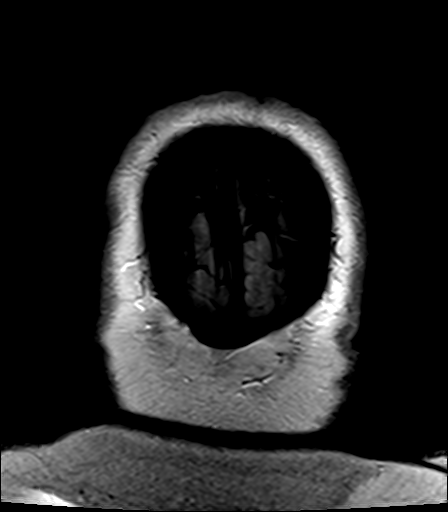
[im 30/30]
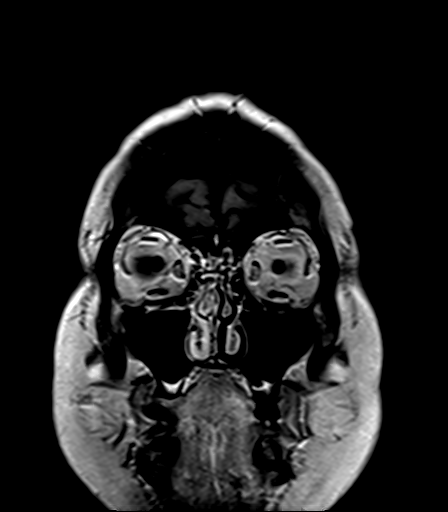

[48 of 48 positions shown; findings below may reference images not displayed]

FINDINGS: Brain: There is no evidence of acute infarct, intracranial
hemorrhage, mass, midline shift, or extra-axial fluid collection.
The ventricles and sulci are normal. The brain is normal in signal.
No abnormal enhancement is identified.

Dedicated imaging was performed through the sella turcica. The
pituitary gland is normal in size with a height of 4 mm. There is a
3-4 mm hypoenhancing focus in the right lateral aspect of the
pituitary gland most conspicuous on early dynamic postcontrast
imaging (for example series 16, image 7). The infundibulum is
midline. The cavernous sinuses, optic chiasm, and hypothalamic
region are unremarkable.

Vascular: Major intracranial vascular flow voids are preserved.

Skull and upper cervical spine: Unremarkable bone marrow signal.

Sinuses/Orbits: Unremarkable orbits. Mild mucosal thickening in the
sphenoid sinuses. Clear mastoid air cells.

Other: None.
IMPRESSION: 1. 3-4 mm focus of hypoenhancement in the pituitary gland which may
represent a microadenoma.
2. Otherwise unremarkable appearance of the brain.

## 2020-05-04 ENCOUNTER — Other Ambulatory Visit: Payer: Self-pay | Admitting: Family Medicine

## 2020-05-04 DIAGNOSIS — B20 Human immunodeficiency virus [HIV] disease: Secondary | ICD-10-CM

## 2020-05-04 NOTE — Telephone Encounter (Signed)
[facility] is requesting  To fill pt biktarvy. KH

## 2020-05-27 ENCOUNTER — Ambulatory Visit: Payer: Managed Care, Other (non HMO) | Admitting: Family Medicine

## 2020-05-29 ENCOUNTER — Encounter: Payer: Self-pay | Admitting: Family Medicine

## 2020-06-12 ENCOUNTER — Other Ambulatory Visit: Payer: Self-pay | Admitting: Family Medicine

## 2020-06-12 DIAGNOSIS — B20 Human immunodeficiency virus [HIV] disease: Secondary | ICD-10-CM

## 2020-06-12 NOTE — Telephone Encounter (Signed)
[facility] is requesting to fill pt Biktarvy. Please advise . KH 

## 2020-08-03 MED ORDER — TESTOSTERONE 20.25 MG/1.25GM (1.62%) TD GEL: 3.0000 | Freq: Every day | TRANSDERMAL | 1 refills | Status: DC

## 2020-09-26 ENCOUNTER — Other Ambulatory Visit: Payer: Self-pay | Admitting: Family Medicine

## 2020-09-26 DIAGNOSIS — E291 Testicular hypofunction: Secondary | ICD-10-CM

## 2020-09-28 NOTE — Telephone Encounter (Signed)
LVM for pt to advise of he need for an appt. I will also mail a letter  [facility]

## 2020-09-28 NOTE — Telephone Encounter (Signed)
LVM for pt to call back for an appt. Patrick Holmes

## 2020-09-28 NOTE — Telephone Encounter (Signed)
Needs an appt

## 2020-09-28 NOTE — Telephone Encounter (Signed)
[facility] is requesting to fill pt testosterone. Please advise KH 

## 2020-10-21 ENCOUNTER — Other Ambulatory Visit: Payer: Self-pay | Admitting: Family Medicine

## 2020-10-21 DIAGNOSIS — B20 Human immunodeficiency virus [HIV] disease: Secondary | ICD-10-CM

## 2020-10-21 NOTE — Telephone Encounter (Signed)
[facility] is requesting to fill pt biktarvy. Please advise KH °

## 2020-10-22 ENCOUNTER — Other Ambulatory Visit: Payer: Self-pay | Admitting: Family Medicine

## 2020-10-22 DIAGNOSIS — E291 Testicular hypofunction: Secondary | ICD-10-CM

## 2020-10-22 NOTE — Telephone Encounter (Signed)
[facility] is requesting to fill pt testosterone. Please advise KH 

## 2020-10-28 ENCOUNTER — Ambulatory Visit: Payer: Managed Care, Other (non HMO) | Admitting: Family Medicine

## 2020-11-08 ENCOUNTER — Other Ambulatory Visit: Payer: Self-pay | Admitting: Family Medicine

## 2020-11-08 DIAGNOSIS — F32A Depression, unspecified: Secondary | ICD-10-CM

## 2020-11-09 ENCOUNTER — Ambulatory Visit (INDEPENDENT_AMBULATORY_CARE_PROVIDER_SITE_OTHER): Payer: Managed Care, Other (non HMO) | Admitting: Family Medicine

## 2020-11-09 ENCOUNTER — Encounter: Payer: Self-pay | Admitting: Family Medicine

## 2020-11-09 ENCOUNTER — Other Ambulatory Visit: Payer: Self-pay

## 2020-11-09 VITALS — BP 140/92 | HR 71 | Temp 97.2°F | Ht 68.5 in | Wt 263.2 lb

## 2020-11-09 DIAGNOSIS — E669 Obesity, unspecified: Secondary | ICD-10-CM | POA: Diagnosis not present

## 2020-11-09 DIAGNOSIS — L989 Disorder of the skin and subcutaneous tissue, unspecified: Secondary | ICD-10-CM

## 2020-11-09 DIAGNOSIS — B20 Human immunodeficiency virus [HIV] disease: Secondary | ICD-10-CM

## 2020-11-09 DIAGNOSIS — Z125 Encounter for screening for malignant neoplasm of prostate: Secondary | ICD-10-CM

## 2020-11-09 DIAGNOSIS — E291 Testicular hypofunction: Secondary | ICD-10-CM

## 2020-11-09 DIAGNOSIS — Z0000 Encounter for general adult medical examination without abnormal findings: Secondary | ICD-10-CM

## 2020-11-09 DIAGNOSIS — J301 Allergic rhinitis due to pollen: Secondary | ICD-10-CM

## 2020-11-09 DIAGNOSIS — F32A Depression, unspecified: Secondary | ICD-10-CM | POA: Diagnosis not present

## 2020-11-09 DIAGNOSIS — I10 Essential (primary) hypertension: Secondary | ICD-10-CM

## 2020-11-09 MED ORDER — TESTOSTERONE 1.62 % TD GEL: TRANSDERMAL | 5 refills | Status: DC

## 2020-11-09 MED ORDER — LISINOPRIL-HYDROCHLOROTHIAZIDE 10-12.5 MG PO TABS: 1.0000 | ORAL_TABLET | Freq: Every day | ORAL | 3 refills | Status: DC

## 2020-11-09 MED ORDER — BIKTARVY 50-200-25 MG PO TABS: 1.0000 | ORAL_TABLET | Freq: Every day | ORAL | 3 refills | Status: DC

## 2020-11-09 MED ORDER — FLUOXETINE HCL 20 MG PO CAPS: 20.0000 mg | ORAL_CAPSULE | Freq: Every day | ORAL | 3 refills | Status: DC

## 2020-11-09 NOTE — Telephone Encounter (Signed)
[facility] is requesting to fill pt prozac. Please advise KH Pt has appt tomorrow.

## 2020-11-09 NOTE — Patient Instructions (Addendum)
Look at your diet in terms of cutting back on carbohydrates .  The easiest way to remember that is white food If you develop swelling or a cough with your new medicine let me know

## 2020-11-09 NOTE — Progress Notes (Signed)
   Subjective:    Patient ID: Patrick Holmes, male    DOB: [birth year], 55 y.o.   MRN: [id number]  HPI He is here for complete examination.  He states that over the last 6 months he has had 2 separate episodes of diarrhea with some slight nausea.  He cannot relate this to anything that he ate.  No fever, chills and vomiting.  He also states that he has had some difficulty with dizziness and relates this to playing softball and moving from one position to another.  He cannot state whether it is when he goes from squatting to standing or not.  He has a lesion present on the right cheek that has not healed over the last 3 weeks.  His work keeps him busy walking over 12,000 steps per day.  He has been trying to cut back on carbohydrates.  He continues on testosterone and having no difficulty with that.  He is takes Radio producer regularly.  He is on HCTZ for his blood pressure.  Continues on Prozac for underlying depression and has no desire to stop that.  His allergies seem to be under good control.  His work and home life are going well.  Family and social history as well as health maintenance immunizations was reviewed.   Review of Systems  All other systems reviewed and are negative.      Objective:   Physical Exam Alert and in no distress.  Exam of his face shows a nonhealing approximately 1 cm lesion present on the left cheek.  Tympanic membranes and canals are normal. Pharyngeal area is normal. Neck is supple without adenopathy or thyromegaly. Cardiac exam shows a regular sinus rhythm without murmurs or gallops. Lungs are clear to auscultation.       Assessment & Plan:  Routine general medical examination at a health care facility - Plan: CBC with Differential/Platelet, Comprehensive metabolic panel, Lipid panel  Facial lesion - Plan: Ambulatory referral to Dermatology  Depression, unspecified depression type - Plan: FLUoxetine (PROZAC) 20 MG capsule  Human immunodeficiency virus (HIV) disease  (HCC) - Plan: CBC with Differential/Platelet, Comprehensive metabolic panel, HIV-1 RNA quant-no reflex-bld, Lipid panel, T-helper cells (CD4) count (not at [facility]), bictegravir-emtricitabine-tenofovir AF (BIKTARVY) 50-200-25 MG TABS tablet  Obesity (BMI 30-39.9) - Plan: CBC with Differential/Platelet, Comprehensive metabolic panel, Lipid panel  Hypogonadism male - Plan: Testosterone, Testosterone 1.62 % GEL  Non-seasonal allergic rhinitis due to pollen  Screening for prostate cancer - Plan: PSA  Primary hypertension - Plan: lisinopril-hydrochlorothiazide (ZESTORETIC) 10-12.5 MG tablet  He will continue his present medication regimen and I will add lisinopril to his regimen as his blood pressure is not under good control.  Discussed possible side effects of the medication including cough and edema. Encouraged him to make further changes in his lifestyle in regard to carbohydrates.

## 2020-11-10 LAB — T-HELPER CELLS (CD4) COUNT (NOT AT ARMC)
% CD 4 Pos. Lymph.: 36 % (ref 30.8–58.5)
Absolute CD 4 Helper: 684 /uL (ref 359–1519)
Basophils Absolute: 0 10*3/uL (ref 0.0–0.2)
Basos: 0 %
EOS (ABSOLUTE): 0.1 10*3/uL (ref 0.0–0.4)
Eos: 2 %
Hematocrit: 47.3 % (ref 37.5–51.0)
Hemoglobin: 16.2 g/dL (ref 13.0–17.7)
Immature Grans (Abs): 0 10*3/uL (ref 0.0–0.1)
Immature Granulocytes: 0 %
Lymphocytes Absolute: 1.9 10*3/uL (ref 0.7–3.1)
Lymphs: 37 %
MCH: 32.1 pg (ref 26.6–33.0)
MCHC: 34.2 g/dL (ref 31.5–35.7)
MCV: 94 fL (ref 79–97)
Monocytes Absolute: 0.3 10*3/uL (ref 0.1–0.9)
Monocytes: 6 %
Neutrophils Absolute: 2.8 10*3/uL (ref 1.4–7.0)
Neutrophils: 55 %
Platelets: 188 10*3/uL (ref 150–450)
RBC: 5.05 x10E6/uL (ref 4.14–5.80)
RDW: 14.1 % (ref 11.6–15.4)
WBC: 5.1 10*3/uL (ref 3.4–10.8)

## 2020-11-10 LAB — LIPID PANEL
Chol/HDL Ratio: 3.3 ratio (ref 0.0–5.0)
Cholesterol, Total: 118 mg/dL (ref 100–199)
HDL: 36 mg/dL — ABNORMAL LOW (ref >39)
LDL Chol Calc (NIH): 68 mg/dL (ref 0–99)
Triglycerides: 67 mg/dL (ref 0–149)
VLDL Cholesterol Cal: 14 mg/dL (ref 5–40)

## 2020-11-10 LAB — COMPREHENSIVE METABOLIC PANEL
ALT: 30 IU/L (ref 0–44)
AST: 22 IU/L (ref 0–40)
Albumin/Globulin Ratio: 1.8 (ref 1.2–2.2)
Albumin: 4.4 g/dL (ref 3.8–4.9)
Alkaline Phosphatase: 61 IU/L (ref 44–121)
BUN/Creatinine Ratio: 8 — ABNORMAL LOW (ref 9–20)
BUN: 8 mg/dL (ref 6–24)
Bilirubin Total: 1.4 mg/dL — ABNORMAL HIGH (ref 0.0–1.2)
CO2: 25 mmol/L (ref 20–29)
Calcium: 9.1 mg/dL (ref 8.7–10.2)
Chloride: 100 mmol/L (ref 96–106)
Creatinine, Ser: 0.99 mg/dL (ref 0.76–1.27)
Globulin, Total: 2.5 g/dL (ref 1.5–4.5)
Glucose: 113 mg/dL — ABNORMAL HIGH (ref 65–99)
Potassium: 3.5 mmol/L (ref 3.5–5.2)
Sodium: 142 mmol/L (ref 134–144)
Total Protein: 6.9 g/dL (ref 6.0–8.5)
eGFR: 91 mL/min/{1.73_m2} (ref >59)

## 2020-11-10 LAB — PSA: Prostate Specific Ag, Serum: 0.2 ng/mL (ref 0.0–4.0)

## 2020-11-10 LAB — HIV-1 RNA QUANT-NO REFLEX-BLD: HIV-1 RNA Viral Load: <20 copies/mL

## 2020-11-10 LAB — TESTOSTERONE: Testosterone: 502 ng/dL (ref 264–916)

## 2020-11-19 ENCOUNTER — Other Ambulatory Visit: Payer: Self-pay | Admitting: Family Medicine

## 2020-11-19 DIAGNOSIS — B20 Human immunodeficiency virus [HIV] disease: Secondary | ICD-10-CM

## 2020-11-19 NOTE — Telephone Encounter (Signed)
[facility] is requesting to fill pt biktarvy. Please advise KH °

## 2020-12-14 ENCOUNTER — Encounter: Payer: Self-pay | Admitting: Family Medicine

## 2020-12-14 ENCOUNTER — Other Ambulatory Visit: Payer: Self-pay

## 2020-12-14 ENCOUNTER — Ambulatory Visit (INDEPENDENT_AMBULATORY_CARE_PROVIDER_SITE_OTHER): Payer: Managed Care, Other (non HMO) | Admitting: Family Medicine

## 2020-12-14 VITALS — BP 138/84 | HR 73 | Ht 69.0 in | Wt 265.0 lb

## 2020-12-14 DIAGNOSIS — I10 Essential (primary) hypertension: Secondary | ICD-10-CM | POA: Diagnosis not present

## 2020-12-14 MED ORDER — LISINOPRIL-HYDROCHLOROTHIAZIDE 20-12.5 MG PO TABS: 1.0000 | ORAL_TABLET | Freq: Every day | ORAL | 3 refills | Status: DC

## 2020-12-14 NOTE — Progress Notes (Signed)
   Subjective:    Patient ID: Patrick Holmes, male    DOB: 05-28-1966, 55 y.o.   MRN: [id number]  HPI For recheck.  He has been taking his medication regularly.   Review of Systems     Objective:   Physical Exam Alert and in no distress.  Blood pressure is recorded       Assessment & Plan:  Primary hypertension - Plan: lisinopril-hydrochlorothiazide (ZESTORETIC) 20-12.5 MG tablet I will increase his Zestoretic.  Also encouraged him to become more physically active.  Recommend he buy a blood pressure cuff and bring it with him when he returns here in 1 month for recheck.

## 2020-12-24 ENCOUNTER — Other Ambulatory Visit: Payer: Self-pay | Admitting: Family Medicine

## 2020-12-24 DIAGNOSIS — B20 Human immunodeficiency virus [HIV] disease: Secondary | ICD-10-CM

## 2020-12-24 NOTE — Telephone Encounter (Signed)
[facility] spec. Requesting to fill pt biktarvy. Please advise.

## 2020-12-26 ENCOUNTER — Telehealth: Payer: Self-pay

## 2020-12-26 NOTE — Telephone Encounter (Signed)
P.A. TESTOSTERONE GEL  °

## 2021-01-02 NOTE — Telephone Encounter (Signed)
Response says covered by plan

## 2021-01-12 ENCOUNTER — Other Ambulatory Visit: Payer: Self-pay | Admitting: Family Medicine

## 2021-01-12 DIAGNOSIS — F32A Depression, unspecified: Secondary | ICD-10-CM

## 2021-01-12 DIAGNOSIS — E291 Testicular hypofunction: Secondary | ICD-10-CM

## 2021-01-12 NOTE — Telephone Encounter (Signed)
[facility] is requesting to fill pt prozac , testosterone. Please advise [facility]

## 2021-01-20 ENCOUNTER — Encounter: Payer: Self-pay | Admitting: Family Medicine

## 2021-01-20 ENCOUNTER — Ambulatory Visit (INDEPENDENT_AMBULATORY_CARE_PROVIDER_SITE_OTHER): Payer: Managed Care, Other (non HMO) | Admitting: Family Medicine

## 2021-01-20 ENCOUNTER — Other Ambulatory Visit: Payer: Self-pay

## 2021-01-20 VITALS — BP 144/98 | HR 73 | Temp 98.1°F | Wt 270.0 lb

## 2021-01-20 DIAGNOSIS — F32A Depression, unspecified: Secondary | ICD-10-CM

## 2021-01-20 DIAGNOSIS — I10 Essential (primary) hypertension: Secondary | ICD-10-CM

## 2021-01-20 DIAGNOSIS — Z566 Other physical and mental strain related to work: Secondary | ICD-10-CM | POA: Diagnosis not present

## 2021-01-20 MED ORDER — LISINOPRIL-HYDROCHLOROTHIAZIDE 20-12.5 MG PO TABS: 1.0000 | ORAL_TABLET | Freq: Every day | ORAL | 0 refills | Status: DC

## 2021-01-20 MED ORDER — FLUOXETINE HCL 20 MG PO CAPS: ORAL_CAPSULE | ORAL | 0 refills | Status: DC

## 2021-01-20 NOTE — Patient Instructions (Signed)
Add Rhinocort nasal spray to your allergy regimen.

## 2021-01-20 NOTE — Progress Notes (Signed)
   Subjective:    Patient ID: Patrick Holmes, male    DOB: [birth year], 55 y.o.   MRN: [id number]  HPI He is here for recheck on his blood pressure.  Unfortunately he ran out of his blood pressure medicine about 5 days ago and has been unable to get delivered.  He also ran out of Prozac and needs more of that.  He does admit that work is become more stressful recently.  He was supposed to bring his blood pressure cuff with him but he neglected to do that.  He also has been having difficulty with a cough and thinks it is allergy related.  The cough has been something that he has had recently and he has been on lisinopril for a long period of time.   Review of Systems     Objective:   Physical Exam Alert and in no distress otherwise not examined       Assessment & Plan:  Primary hypertension - Plan: lisinopril-hydrochlorothiazide (ZESTORETIC) 20-12.5 MG tablet  Depression, unspecified depression type - Plan: FLUoxetine (PROZAC) 20 MG capsule  Work stress I will start him back on his lisinopril and renew Prozac.  30-day supplies of each is given.  Hopefully by then the supply house will give him the 90-day course.  He is to bring his blood pressure cuff with him in another month for recheck. Also recommend he discuss the stress he is under with EAP.

## 2021-02-05 ENCOUNTER — Other Ambulatory Visit: Payer: Self-pay | Admitting: Family Medicine

## 2021-02-05 DIAGNOSIS — E291 Testicular hypofunction: Secondary | ICD-10-CM

## 2021-02-24 ENCOUNTER — Ambulatory Visit: Payer: Managed Care, Other (non HMO) | Admitting: Family Medicine

## 2021-03-22 ENCOUNTER — Other Ambulatory Visit: Payer: Self-pay

## 2021-03-22 ENCOUNTER — Ambulatory Visit (INDEPENDENT_AMBULATORY_CARE_PROVIDER_SITE_OTHER): Payer: Managed Care, Other (non HMO) | Admitting: Family Medicine

## 2021-03-22 VITALS — BP 122/80 | HR 88 | Temp 98.0°F | Wt 264.4 lb

## 2021-03-22 DIAGNOSIS — I10 Essential (primary) hypertension: Secondary | ICD-10-CM

## 2021-03-22 DIAGNOSIS — T464X5A Adverse effect of angiotensin-converting-enzyme inhibitors, initial encounter: Secondary | ICD-10-CM | POA: Insufficient documentation

## 2021-03-22 DIAGNOSIS — R058 Other specified cough: Secondary | ICD-10-CM | POA: Insufficient documentation

## 2021-03-22 DIAGNOSIS — Z566 Other physical and mental strain related to work: Secondary | ICD-10-CM | POA: Diagnosis not present

## 2021-03-22 MED ORDER — LOSARTAN POTASSIUM-HCTZ 100-12.5 MG PO TABS: 1.0000 | ORAL_TABLET | Freq: Every day | ORAL | 1 refills | Status: DC

## 2021-03-22 NOTE — Progress Notes (Signed)
   Subjective:    Patient ID: Patrick Holmes, male    DOB: 10/08/1965, 55 y.o.   MRN: [id number]  HPI He is here for recheck on his blood pressure.  He now states that since he has been on lisinopril, he has had a cough.  In the past he had related this more to her allergies is more definitive than stating that is related to his lisinopril.  He then stated that he needed a note for work.  He took 3 days off from work because of work-related stress, home stress in general anxiety.  So far he is not involved in counseling.  He continues on Prozac but thinks that this medicine is not really that useful at the present time.   Review of Systems     Objective:   Physical Exam Alert and in no distress but does look despondent.       Assessment & Plan:  Primary hypertension - Plan: losartan-hydrochlorothiazide (HYZAAR) 100-12.5 MG tablet  Work stress  ACE-inhibitor cough I will switch him to Hyzaar.  He will need to return here in roughly 6 weeks to check that and also to see how he is doing psychologically.  Discussed counseling with him and recommended either [facility] or [facility].  Explained to him that I will write him a note but I cannot continue to do this since I have no input into his taking time off. I discussed stress and stress management with him.  He seems reluctant thinking that he should be able to handle this in spite of the fact that he obviously has not been able to.  32 minutes spent in counseling and coordination of care.

## 2021-03-22 NOTE — Patient Instructions (Signed)
Call [facility] will call [facility] [phone]

## 2021-03-23 ENCOUNTER — Other Ambulatory Visit: Payer: Self-pay

## 2021-03-23 DIAGNOSIS — I10 Essential (primary) hypertension: Secondary | ICD-10-CM

## 2021-03-23 MED ORDER — LOSARTAN POTASSIUM-HCTZ 100-12.5 MG PO TABS: 1.0000 | ORAL_TABLET | Freq: Every day | ORAL | 1 refills | Status: DC

## 2021-03-29 ENCOUNTER — Telehealth: Payer: Self-pay

## 2021-03-29 NOTE — Telephone Encounter (Signed)
Per patient testosterone approved

## 2021-04-05 ENCOUNTER — Other Ambulatory Visit: Payer: Self-pay | Admitting: Family Medicine

## 2021-04-05 DIAGNOSIS — E291 Testicular hypofunction: Secondary | ICD-10-CM

## 2021-04-06 NOTE — Telephone Encounter (Signed)
[facility] is requesting to fill pt testosterone. Please advise KH 

## 2021-05-03 ENCOUNTER — Ambulatory Visit (INDEPENDENT_AMBULATORY_CARE_PROVIDER_SITE_OTHER): Payer: Managed Care, Other (non HMO) | Admitting: Family Medicine

## 2021-05-03 ENCOUNTER — Other Ambulatory Visit: Payer: Self-pay

## 2021-05-03 VITALS — BP 130/90 | HR 72 | Temp 97.5°F | Wt 265.8 lb

## 2021-05-03 DIAGNOSIS — T464X5A Adverse effect of angiotensin-converting-enzyme inhibitors, initial encounter: Secondary | ICD-10-CM | POA: Diagnosis not present

## 2021-05-03 DIAGNOSIS — I10 Essential (primary) hypertension: Secondary | ICD-10-CM

## 2021-05-03 DIAGNOSIS — R058 Other specified cough: Secondary | ICD-10-CM | POA: Diagnosis not present

## 2021-05-03 DIAGNOSIS — Z23 Encounter for immunization: Secondary | ICD-10-CM | POA: Diagnosis not present

## 2021-05-03 DIAGNOSIS — E669 Obesity, unspecified: Secondary | ICD-10-CM

## 2021-05-03 NOTE — Progress Notes (Signed)
   Subjective:    Patient ID: Patrick Holmes, male    DOB: [birth year], 55 y.o.   MRN: [id number]  HPI He is here for recheck on his blood pressure.  He was switched to Hyzaar for blood pressure control.  He is having no difficulty with that medication. He was switched due to an ACE cough.  Review of Systems     Objective:   Physical Exam Alert and in no distress.  Blood pressure is recorded.       Assessment & Plan:  Need for influenza vaccination - Plan: Flu Vaccine QUAD 6+ mos PF IM (Fluarix Quad PF)  Primary hypertension  ACE-inhibitor cough  Obesity (BMI 30-39.9) I explained that his blood pressure is not quite where I need to be but I am willing to leave there for now and encourage him to continue with his diet and exercise which he apparently has been doing.

## 2021-06-08 ENCOUNTER — Other Ambulatory Visit: Payer: Self-pay | Admitting: Family Medicine

## 2021-06-08 DIAGNOSIS — E291 Testicular hypofunction: Secondary | ICD-10-CM

## 2021-06-09 NOTE — Telephone Encounter (Signed)
[facility] Please advise [facility] 

## 2021-07-27 ENCOUNTER — Other Ambulatory Visit: Payer: Self-pay | Admitting: Family Medicine

## 2021-07-27 DIAGNOSIS — E291 Testicular hypofunction: Secondary | ICD-10-CM

## 2021-07-27 DIAGNOSIS — F32A Depression, unspecified: Secondary | ICD-10-CM

## 2021-07-27 NOTE — Telephone Encounter (Signed)
[facility] is requesting too fill  pt prozac and testosterone. Please advise [facility]

## 2021-08-06 ENCOUNTER — Ambulatory Visit (INDEPENDENT_AMBULATORY_CARE_PROVIDER_SITE_OTHER): Payer: Managed Care, Other (non HMO) | Admitting: Family Medicine

## 2021-08-06 ENCOUNTER — Encounter: Payer: Self-pay | Admitting: Family Medicine

## 2021-08-06 ENCOUNTER — Other Ambulatory Visit: Payer: Self-pay

## 2021-08-06 VITALS — BP 138/88 | HR 77 | Temp 98.3°F | Wt 261.0 lb

## 2021-08-06 DIAGNOSIS — Z23 Encounter for immunization: Secondary | ICD-10-CM | POA: Diagnosis not present

## 2021-08-06 DIAGNOSIS — E669 Obesity, unspecified: Secondary | ICD-10-CM | POA: Diagnosis not present

## 2021-08-06 DIAGNOSIS — Z125 Encounter for screening for malignant neoplasm of prostate: Secondary | ICD-10-CM

## 2021-08-06 DIAGNOSIS — I10 Essential (primary) hypertension: Secondary | ICD-10-CM

## 2021-08-06 DIAGNOSIS — Z1159 Encounter for screening for other viral diseases: Secondary | ICD-10-CM

## 2021-08-06 DIAGNOSIS — E291 Testicular hypofunction: Secondary | ICD-10-CM

## 2021-08-06 DIAGNOSIS — F32A Depression, unspecified: Secondary | ICD-10-CM

## 2021-08-06 DIAGNOSIS — B20 Human immunodeficiency virus [HIV] disease: Secondary | ICD-10-CM

## 2021-08-06 MED ORDER — TESTOSTERONE 1.62 % TD GEL: TRANSDERMAL | 5 refills | Status: DC

## 2021-08-06 MED ORDER — LOSARTAN POTASSIUM-HCTZ 100-12.5 MG PO TABS: 1.0000 | ORAL_TABLET | Freq: Every day | ORAL | 3 refills | Status: DC

## 2021-08-06 MED ORDER — FLUOXETINE HCL 20 MG PO CAPS
20.0000 mg | ORAL_CAPSULE | Freq: Every day | ORAL | 3 refills | Status: DC
Start: 2021-08-06 — End: 2021-09-02

## 2021-08-06 MED ORDER — BIKTARVY 50-200-25 MG PO TABS: 1.0000 | ORAL_TABLET | Freq: Every day | ORAL | 3 refills | Status: DC

## 2021-08-06 NOTE — Progress Notes (Signed)
° °  Subjective:    Patient ID: Patrick Holmes, male    DOB: 02-12-1966, 55 y.o.   MRN: [id number]  HPI He is here for an interval evaluation.  He continues on testosterone and is having no difficulty with that.  His energy and strength and stamina seem to be good.  He continues on [place] and has had no fever, chills, weight change, headaches.  Prozac continues to work to keep him psychologically stable.  He continues on his losartan/HCTZ.  He is in a 29-year relationship.  Work is going fairly well although stressful.  He does not smoke.   Review of Systems     Objective:   Physical Exam Alert and in no distress. Tympanic membranes and canals are normal. Pharyngeal area is normal. Neck is supple without adenopathy or thyromegaly. Cardiac exam shows a regular sinus rhythm without murmurs or gallops. Lungs are clear to auscultation.        Assessment & Plan:  Primary hypertension - Plan: losartan-hydrochlorothiazide (HYZAAR) 100-12.5 MG tablet  Obesity (BMI 30-39.9) - Plan: Lipid panel  Depression, unspecified depression type - Plan: FLUoxetine (PROZAC) 20 MG capsule  Human immunodeficiency virus (HIV) disease (HCC) - Plan: CBC with Differential/Platelet, Comprehensive metabolic panel, HIV-1 RNA quant-no reflex-bld, T-helper cells (CD4) count (not at [facility]), bictegravir-emtricitabine-tenofovir AF (BIKTARVY) 50-200-25 MG TABS tablet  Hypogonadism male - Plan: Testosterone, Testosterone 1.62 % GEL  Screening for prostate cancer - Plan: PSA  Immunization, viral disease - Plan: [employer] Booster  Need for hepatitis C screening test - Plan: Hepatitis C antibody

## 2021-08-09 LAB — COMPREHENSIVE METABOLIC PANEL
ALT: 25 IU/L (ref 0–44)
AST: 16 IU/L (ref 0–40)
Albumin/Globulin Ratio: 1.9 (ref 1.2–2.2)
Albumin: 4.4 g/dL (ref 3.8–4.9)
Alkaline Phosphatase: 64 IU/L (ref 44–121)
BUN/Creatinine Ratio: 17 (ref 9–20)
BUN: 14 mg/dL (ref 6–24)
Bilirubin Total: 1.3 mg/dL — ABNORMAL HIGH (ref 0.0–1.2)
CO2: 24 mmol/L (ref 20–29)
Calcium: 9.1 mg/dL (ref 8.7–10.2)
Chloride: 106 mmol/L (ref 96–106)
Creatinine, Ser: 0.84 mg/dL (ref 0.76–1.27)
Globulin, Total: 2.3 g/dL (ref 1.5–4.5)
Glucose: 94 mg/dL (ref 70–99)
Potassium: 3.9 mmol/L (ref 3.5–5.2)
Sodium: 144 mmol/L (ref 134–144)
Total Protein: 6.7 g/dL (ref 6.0–8.5)
eGFR: 104 mL/min/{1.73_m2} (ref >59)

## 2021-08-09 LAB — LIPID PANEL
Chol/HDL Ratio: 3.4 ratio (ref 0.0–5.0)
Cholesterol, Total: 150 mg/dL (ref 100–199)
HDL: 44 mg/dL (ref >39)
LDL Chol Calc (NIH): 85 mg/dL (ref 0–99)
Triglycerides: 119 mg/dL (ref 0–149)
VLDL Cholesterol Cal: 21 mg/dL (ref 5–40)

## 2021-08-09 LAB — T-HELPER CELLS (CD4) COUNT (NOT AT ARMC)
% CD 4 Pos. Lymph.: 37.9 % (ref 30.8–58.5)
Absolute CD 4 Helper: 682 /uL (ref 359–1519)
Basophils Absolute: 0 10*3/uL (ref 0.0–0.2)
Basos: 1 %
EOS (ABSOLUTE): 0.1 10*3/uL (ref 0.0–0.4)
Eos: 2 %
Hematocrit: 45.7 % (ref 37.5–51.0)
Hemoglobin: 15.7 g/dL (ref 13.0–17.7)
Immature Grans (Abs): 0 10*3/uL (ref 0.0–0.1)
Immature Granulocytes: 0 %
Lymphocytes Absolute: 1.8 10*3/uL (ref 0.7–3.1)
Lymphs: 39 %
MCH: 32.2 pg (ref 26.6–33.0)
MCHC: 34.4 g/dL (ref 31.5–35.7)
MCV: 94 fL (ref 79–97)
Monocytes Absolute: 0.3 10*3/uL (ref 0.1–0.9)
Monocytes: 7 %
Neutrophils Absolute: 2.4 10*3/uL (ref 1.4–7.0)
Neutrophils: 51 %
Platelets: 189 10*3/uL (ref 150–450)
RBC: 4.88 x10E6/uL (ref 4.14–5.80)
RDW: 13.7 % (ref 11.6–15.4)
WBC: 4.7 10*3/uL (ref 3.4–10.8)

## 2021-08-09 LAB — TESTOSTERONE: Testosterone: 211 ng/dL — ABNORMAL LOW (ref 264–916)

## 2021-08-09 LAB — HIV-1 RNA QUANT-NO REFLEX-BLD
HIV-1 RNA Viral Load Log: 1.602 log10copy/mL
HIV-1 RNA Viral Load: 40 copies/mL

## 2021-08-09 LAB — HEPATITIS C ANTIBODY: Hep C Virus Ab: <0.1 s/co ratio (ref 0.0–0.9)

## 2021-08-09 LAB — PSA: Prostate Specific Ag, Serum: <0.1 ng/mL (ref 0.0–4.0)

## 2021-09-02 ENCOUNTER — Other Ambulatory Visit: Payer: Self-pay | Admitting: Family Medicine

## 2021-09-02 DIAGNOSIS — F32A Depression, unspecified: Secondary | ICD-10-CM

## 2021-09-02 NOTE — Telephone Encounter (Signed)
[facility] is requesting to fill pt prozac . Please advise. KH °

## 2021-09-08 ENCOUNTER — Encounter: Payer: Self-pay | Admitting: Family Medicine

## 2021-09-08 ENCOUNTER — Other Ambulatory Visit: Payer: Self-pay

## 2021-09-08 ENCOUNTER — Ambulatory Visit (INDEPENDENT_AMBULATORY_CARE_PROVIDER_SITE_OTHER): Payer: Managed Care, Other (non HMO) | Admitting: Family Medicine

## 2021-09-08 VITALS — BP 120/84 | HR 91 | Temp 97.7°F | Wt 266.4 lb

## 2021-09-08 DIAGNOSIS — E291 Testicular hypofunction: Secondary | ICD-10-CM | POA: Diagnosis not present

## 2021-09-08 NOTE — Progress Notes (Signed)
° °  Subjective:    Patient ID: Patrick Holmes, male    DOB: [birth year], 56 y.o.   MRN: [id number]  HPI He is here for recheck.  He is now taking testosterone and does note an increase in his energy.  He does not think that it is as good a result as he got last summer.  Pumps per day.  He is using tube  Review of Systems     Objective:   Physical Exam  Alert and in no distress otherwise not examined      Assessment & Plan:  Hypogonadism male - Plan: Testosterone

## 2021-09-09 LAB — TESTOSTERONE: Testosterone: 219 ng/dL — ABNORMAL LOW (ref 264–916)

## 2021-09-09 MED ORDER — TESTOSTERONE 1.62 % TD GEL: TRANSDERMAL | 5 refills | Status: DC

## 2021-09-09 NOTE — Addendum Note (Signed)
Addended by: Denita Lung on: 09/09/2021 08:45 AM   Modules accepted: Orders

## 2021-09-09 NOTE — Addendum Note (Signed)
Addended by: Ronnald Nian on: 09/09/2021 08:42 AM   Modules accepted: Orders

## 2021-09-13 ENCOUNTER — Other Ambulatory Visit: Payer: Self-pay | Admitting: Family Medicine

## 2021-09-13 DIAGNOSIS — E291 Testicular hypofunction: Secondary | ICD-10-CM

## 2021-10-13 ENCOUNTER — Other Ambulatory Visit: Payer: Self-pay

## 2021-10-13 ENCOUNTER — Other Ambulatory Visit: Payer: Managed Care, Other (non HMO)

## 2021-10-13 DIAGNOSIS — E291 Testicular hypofunction: Secondary | ICD-10-CM

## 2021-10-14 LAB — TESTOSTERONE: Testosterone: 453 ng/dL (ref 264–916)

## 2021-12-25 ENCOUNTER — Telehealth: Payer: Self-pay

## 2021-12-25 NOTE — Telephone Encounter (Signed)
P.A. TESTOSTERONE GEL  °

## 2022-01-01 NOTE — Telephone Encounter (Signed)
Response received P.A. not needed

## 2022-03-02 ENCOUNTER — Other Ambulatory Visit: Payer: Self-pay | Admitting: Family Medicine

## 2022-03-02 DIAGNOSIS — E291 Testicular hypofunction: Secondary | ICD-10-CM

## 2022-03-02 NOTE — Telephone Encounter (Signed)
[facility] requesting to fill pt testosterone. Please advise [facility]

## 2022-03-23 ENCOUNTER — Encounter: Payer: Managed Care, Other (non HMO) | Admitting: Family Medicine

## 2022-03-31 ENCOUNTER — Other Ambulatory Visit: Payer: Self-pay | Admitting: Family Medicine

## 2022-03-31 DIAGNOSIS — F32A Depression, unspecified: Secondary | ICD-10-CM

## 2022-03-31 NOTE — Telephone Encounter (Signed)
Patrick Holmes is requesting to fill pt prozac. He has appt with you tomorrow. Please advise [facility]

## 2022-03-31 NOTE — Telephone Encounter (Signed)
Left pt a VM to see if he needs refill before Monday

## 2022-03-31 NOTE — Telephone Encounter (Signed)
Lvm for pt to call back if he needs this before his appt. On Monday. KH

## 2022-04-04 ENCOUNTER — Ambulatory Visit (INDEPENDENT_AMBULATORY_CARE_PROVIDER_SITE_OTHER): Payer: Managed Care, Other (non HMO) | Admitting: Family Medicine

## 2022-04-04 ENCOUNTER — Encounter: Payer: Self-pay | Admitting: Family Medicine

## 2022-04-04 VITALS — BP 122/84 | HR 75 | Temp 98.3°F | Ht 68.0 in | Wt 274.6 lb

## 2022-04-04 DIAGNOSIS — B20 Human immunodeficiency virus [HIV] disease: Secondary | ICD-10-CM

## 2022-04-04 DIAGNOSIS — Z0000 Encounter for general adult medical examination without abnormal findings: Secondary | ICD-10-CM | POA: Diagnosis not present

## 2022-04-04 DIAGNOSIS — E669 Obesity, unspecified: Secondary | ICD-10-CM | POA: Diagnosis not present

## 2022-04-04 DIAGNOSIS — J301 Allergic rhinitis due to pollen: Secondary | ICD-10-CM

## 2022-04-04 DIAGNOSIS — E291 Testicular hypofunction: Secondary | ICD-10-CM

## 2022-04-04 DIAGNOSIS — F32A Depression, unspecified: Secondary | ICD-10-CM

## 2022-04-04 DIAGNOSIS — I10 Essential (primary) hypertension: Secondary | ICD-10-CM | POA: Diagnosis not present

## 2022-04-04 DIAGNOSIS — E237 Disorder of pituitary gland, unspecified: Secondary | ICD-10-CM | POA: Insufficient documentation

## 2022-04-04 DIAGNOSIS — Z125 Encounter for screening for malignant neoplasm of prostate: Secondary | ICD-10-CM

## 2022-04-04 MED ORDER — FLUOXETINE HCL 20 MG PO CAPS: ORAL_CAPSULE | ORAL | 3 refills | Status: DC

## 2022-04-04 MED ORDER — TESTOSTERONE 1.62 % TD GEL: TRANSDERMAL | 5 refills | Status: DC

## 2022-04-04 MED ORDER — LOSARTAN POTASSIUM-HCTZ 100-12.5 MG PO TABS: 1.0000 | ORAL_TABLET | Freq: Every day | ORAL | 3 refills | Status: DC

## 2022-04-04 MED ORDER — BIKTARVY 50-200-25 MG PO TABS: 1.0000 | ORAL_TABLET | Freq: Every day | ORAL | 3 refills | Status: DC

## 2022-04-04 NOTE — Progress Notes (Signed)
Complete physical exam  Patient: Patrick Holmes   DOB: 11/29/1965   56 y.o. Male  MRN: [id number]  Subjective:    Chief Complaint  Patient presents with   Annual Exam    CPE ringing rt. Ear about 1 week no pain     Patrick Holmes is a 56 y.o. male who presents today for a complete physical exam. He reports consuming a general diet. Home exercise routine includes walking and yoga hrs per day. He generally feels fairly well. He reports sleeping well. He does not have additional problems to discuss today.  He continues on testosterone 2 pumps per day and is doing well on that.  Continues on Prozac and is very happy with the fact that it keeps him in a good psychological space.  Continues on [place] and having no difficulty with that.  Also taking losartan/HCTZ.  His work and home life are going well.  He is under stress dealing with his mother who lives in [place] requiring him to travel back and forth frequently.  Also his partner is apparently having his parents move into the area which has his partner quite stressed.  Patrick Holmes does not seem to be overly concerned about that.  Otherwise his family and social history as well as health maintenance and immunizations was reviewed.   Most recent fall risk assessment:    04/04/2022    3:20 PM  Fall Risk   Falls in the past year? 0  Number falls in past yr: 0  Injury with Fall? 0  Risk for fall due to : No Fall Risks  Follow up Falls evaluation completed     Most recent depression screenings:    04/04/2022    3:21 PM 12/14/2020    8:30 AM  PHQ 2/9 Scores  PHQ - 2 Score 0 0        Patient Care Team: Ronnald Nian, MD as PCP - General (Family Medicine)   Outpatient Medications Prior to Visit  Medication Sig   levocetirizine (XYZAL) 5 MG tablet Take 5 mg by mouth every evening.   [DISCONTINUED] bictegravir-emtricitabine-tenofovir AF (BIKTARVY) 50-200-25 MG TABS tablet Take 1 tablet by mouth daily.   [DISCONTINUED]  FLUoxetine (PROZAC) 20 MG capsule TAKE 1 CAPSULE(20 MG) BY MOUTH DAILY   [DISCONTINUED] losartan-hydrochlorothiazide (HYZAAR) 100-12.5 MG tablet Take 1 tablet by mouth daily.   [DISCONTINUED] Testosterone 1.62 % GEL APPLY 2 PUMPS TOPICALLY TO THE AFFECTED AREA DAILY   ketoconazole (NIZORAL) 2 % shampoo Apply topically once a week. (Patient not taking: Reported on 03/22/2021)   No facility-administered medications prior to visit.    Review of Systems  All other systems reviewed and are negative.         Objective:     BP 122/84   Pulse 75   Temp 98.3 F (36.8 C)   Ht 5\' 8"  (1.727 m)   Wt 274 lb 9.6 oz (124.6 kg)   BMI 41.75 kg/m    Physical Exam   Alert and in no distress. Tympanic membranes and canals are normal. Pharyngeal area is normal. Neck is supple without adenopathy or thyromegaly. Cardiac exam shows a regular sinus rhythm without murmurs or gallops. Lungs are clear to auscultation.      Assessment & Plan:    Routine general medical examination at a health care facility - Plan: CBC with Differential/Platelet, Comprehensive metabolic panel, Lipid panel  Hypogonadism male - Plan: Testosterone, Testosterone 1.62 % GEL  Primary hypertension - Plan: CBC  with Differential/Platelet, Comprehensive metabolic panel, losartan-hydrochlorothiazide (HYZAAR) 100-12.5 MG tablet  Obesity (BMI 30-39.9) - Plan: CBC with Differential/Platelet, Comprehensive metabolic panel, HIV-1 RNA quant-no reflex-bld  Depression, unspecified depression type - Plan: FLUoxetine (PROZAC) 20 MG capsule  Human immunodeficiency virus (HIV) disease (HCC) - Plan: CBC with Differential/Platelet, Comprehensive metabolic panel, HIV-1 RNA quant-no reflex-bld, Lipid panel, T-helper cells (CD4) count (not at [facility]), bictegravir-emtricitabine-tenofovir AF (BIKTARVY) 50-200-25 MG TABS tablet  Non-seasonal allergic rhinitis due to pollen  Screening for prostate cancer - Plan: PSA  Immunization History   Administered Date(s) Administered   Hepatitis A 12/16/2005   Hepatitis B 01/02/2012   Influenza,inj,Quad PF,6+ Mos 05/14/2013, 05/13/2014, 06/09/2015, 05/03/2016, 04/20/2017, 09/05/2018, 10/02/2019, 05/03/2021   PFIZER(Purple Top)SARS-COV-2 Vaccination 10/30/2019, 11/20/2019, 07/28/2020   Pfizer Covid-19 Vaccine Bivalent Booster 29yrs & up 08/06/2021   Pneumococcal Conjugate-13 12/31/2013   Pneumococcal Polysaccharide-23 06/17/2005   Tdap 12/31/2013    Health Maintenance  Topic Date Due   Zoster Vaccines- Shingrix (1 of 2) Never done   COVID-19 Vaccine (5 - Pfizer risk series) 10/01/2021   INFLUENZA VACCINE  03/22/2022   Fecal DNA (Cologuard)  12/24/2022   TETANUS/TDAP  01/01/2024   Hepatitis C Screening  Completed   HIV Screening  Completed   HPV VACCINES  Aged Out    Discussed health benefits of physical activity, and encouraged him to engage in regular exercise appropriate for his age and condition.  Problem List Items Addressed This Visit     Allergic rhinitis due to pollen   Depression   Relevant Medications   FLUoxetine (PROZAC) 20 MG capsule   Human immunodeficiency virus (HIV) disease (HCC)   Relevant Medications   bictegravir-emtricitabine-tenofovir AF (BIKTARVY) 50-200-25 MG TABS tablet   Other Relevant Orders   CBC with Differential/Platelet   Comprehensive metabolic panel   HIV-1 RNA quant-no reflex-bld   Lipid panel   T-helper cells (CD4) count (not at [facility])   Hypogonadism male   Relevant Medications   Testosterone 1.62 % GEL   Other Relevant Orders   Testosterone   Obesity (BMI 30-39.9)   Relevant Orders   CBC with Differential/Platelet   Comprehensive metabolic panel   HIV-1 RNA quant-no reflex-bld   Other Visit Diagnoses     Routine general medical examination at a health care facility    -  Primary   Relevant Orders   CBC with Differential/Platelet   Comprehensive metabolic panel   Lipid panel   Primary hypertension       Relevant  Medications   losartan-hydrochlorothiazide (HYZAAR) 100-12.5 MG tablet   Other Relevant Orders   CBC with Differential/Platelet   Comprehensive metabolic panel   Screening for prostate cancer       Relevant Orders   PSA     Continue on present medication regimen.  He seems to be handling the situation with his mother fairly appropriately. Follow-up 6 months    Sharlot Gowda, MD

## 2022-04-05 LAB — COMPREHENSIVE METABOLIC PANEL
ALT: 32 IU/L (ref 0–44)
AST: 22 IU/L (ref 0–40)
Albumin/Globulin Ratio: 1.7 (ref 1.2–2.2)
Albumin: 4.5 g/dL (ref 3.8–4.9)
Alkaline Phosphatase: 59 IU/L (ref 44–121)
BUN/Creatinine Ratio: 10 (ref 9–20)
BUN: 9 mg/dL (ref 6–24)
Bilirubin Total: 2.1 mg/dL — ABNORMAL HIGH (ref 0.0–1.2)
CO2: 23 mmol/L (ref 20–29)
Calcium: 9.4 mg/dL (ref 8.7–10.2)
Chloride: 100 mmol/L (ref 96–106)
Creatinine, Ser: 0.92 mg/dL (ref 0.76–1.27)
Globulin, Total: 2.6 g/dL (ref 1.5–4.5)
Glucose: 86 mg/dL (ref 70–99)
Potassium: 3.6 mmol/L (ref 3.5–5.2)
Sodium: 140 mmol/L (ref 134–144)
Total Protein: 7.1 g/dL (ref 6.0–8.5)
eGFR: 98 mL/min/{1.73_m2} (ref >59)

## 2022-04-05 LAB — LIPID PANEL
Chol/HDL Ratio: 3.3 ratio (ref 0.0–5.0)
Cholesterol, Total: 152 mg/dL (ref 100–199)
HDL: 46 mg/dL (ref >39)
LDL Chol Calc (NIH): 85 mg/dL (ref 0–99)
Triglycerides: 114 mg/dL (ref 0–149)
VLDL Cholesterol Cal: 21 mg/dL (ref 5–40)

## 2022-04-05 LAB — T-HELPER CELLS (CD4) COUNT (NOT AT ARMC)
% CD 4 Pos. Lymph.: 37.1 % (ref 30.8–58.5)
Absolute CD 4 Helper: 853 /uL (ref 359–1519)
Basophils Absolute: 0 10*3/uL (ref 0.0–0.2)
Basos: 1 %
EOS (ABSOLUTE): 0.1 10*3/uL (ref 0.0–0.4)
Eos: 2 %
Hematocrit: 46.6 % (ref 37.5–51.0)
Hemoglobin: 16.4 g/dL (ref 13.0–17.7)
Immature Grans (Abs): 0 10*3/uL (ref 0.0–0.1)
Immature Granulocytes: 0 %
Lymphocytes Absolute: 2.3 10*3/uL (ref 0.7–3.1)
Lymphs: 43 %
MCH: 32.6 pg (ref 26.6–33.0)
MCHC: 35.2 g/dL (ref 31.5–35.7)
MCV: 93 fL (ref 79–97)
Monocytes Absolute: 0.4 10*3/uL (ref 0.1–0.9)
Monocytes: 7 %
Neutrophils Absolute: 2.6 10*3/uL (ref 1.4–7.0)
Neutrophils: 47 %
Platelets: 176 10*3/uL (ref 150–450)
RBC: 5.03 x10E6/uL (ref 4.14–5.80)
RDW: 14.4 % (ref 11.6–15.4)
WBC: 5.3 10*3/uL (ref 3.4–10.8)

## 2022-04-05 LAB — TESTOSTERONE: Testosterone: 178 ng/dL — ABNORMAL LOW (ref 264–916)

## 2022-04-05 LAB — HIV-1 RNA QUANT-NO REFLEX-BLD: HIV-1 RNA Viral Load: <20 copies/mL

## 2022-04-05 LAB — PSA: Prostate Specific Ag, Serum: 0.2 ng/mL (ref 0.0–4.0)

## 2022-08-25 ENCOUNTER — Other Ambulatory Visit: Payer: Self-pay | Admitting: Family Medicine

## 2022-08-25 DIAGNOSIS — B20 Human immunodeficiency virus [HIV] disease: Secondary | ICD-10-CM

## 2022-08-25 NOTE — Telephone Encounter (Signed)
Is this okay to refill? 

## 2022-09-28 ENCOUNTER — Telehealth (INDEPENDENT_AMBULATORY_CARE_PROVIDER_SITE_OTHER): Payer: Managed Care, Other (non HMO) | Admitting: Family Medicine

## 2022-09-28 ENCOUNTER — Encounter: Payer: Self-pay | Admitting: Family Medicine

## 2022-09-28 ENCOUNTER — Telehealth: Payer: Self-pay | Admitting: Family Medicine

## 2022-09-28 ENCOUNTER — Other Ambulatory Visit (INDEPENDENT_AMBULATORY_CARE_PROVIDER_SITE_OTHER): Payer: Managed Care, Other (non HMO)

## 2022-09-28 DIAGNOSIS — U071 COVID-19: Secondary | ICD-10-CM | POA: Diagnosis not present

## 2022-09-28 DIAGNOSIS — J029 Acute pharyngitis, unspecified: Secondary | ICD-10-CM | POA: Diagnosis not present

## 2022-09-28 LAB — POC COVID19 BINAXNOW: SARS Coronavirus 2 Ag: POSITIVE — AB

## 2022-09-28 MED ORDER — NIRMATRELVIR/RITONAVIR (PAXLOVID)TABLET: 3.0000 | ORAL_TABLET | Freq: Two times a day (BID) | ORAL | 0 refills | Status: AC

## 2022-09-28 NOTE — Telephone Encounter (Signed)
work note emailed to pt per Dr. Redmond School

## 2022-09-28 NOTE — Progress Notes (Signed)
   Subjective:    Patient ID: Patrick Holmes, male    DOB: August 29, 1965, 57 y.o.   MRN: [id number]  HPI Documentation for virtual audio and video telecommunications through [place] encounter: The patient was located at home. 2 patient identifiers used.  The provider was located in the office. The patient did consent to this visit and is aware of possible charges through their insurance for this visit. The other persons participating in this telemedicine service were none. Time spent on call was 5 minutes and in review of previous records >20 minutes total for counseling and coordination of care. This virtual service is not related to other E/M service within previous 7 days.  He started having difficulty last Sunday with sore throat, headache, myalgias and over the next day or so had more trouble with fatigue, hoarse voice, cough and chest tightness.  The symptoms were not really improving.  Review of Systems     Objective:   Physical Exam COVID test here outside was positive. Breathing pattern appears normal.  He does have a hoarse voice.      Assessment & Plan:  COVID-19 - Plan: nirmatrelvir/ritonavir (PAXLOVID) 20 x 150 MG & 10 x 100MG  TABS Discussed the use of this medication and also treat his symptoms with Tylenol, decongestants, NSAID on an as-needed basis.  Cough medicines.  If his coughing, shortness of breath and fever become more of an issue, he will need to be seen in the hospital.  He was comfortable with that.  After 5 days if he is feeling better he may return to work wearing a mask for another 5 days.  A note will be given for work.

## 2022-10-26 ENCOUNTER — Other Ambulatory Visit: Payer: Self-pay | Admitting: Family Medicine

## 2022-10-26 DIAGNOSIS — E291 Testicular hypofunction: Secondary | ICD-10-CM

## 2022-10-26 NOTE — Telephone Encounter (Signed)
Is this okay to refill? 

## 2023-03-17 ENCOUNTER — Other Ambulatory Visit: Payer: Self-pay | Admitting: Family Medicine

## 2023-03-17 DIAGNOSIS — B20 Human immunodeficiency virus [HIV] disease: Secondary | ICD-10-CM

## 2023-03-17 NOTE — Telephone Encounter (Signed)
Refill request last apt 09/28/22.

## 2023-03-21 ENCOUNTER — Other Ambulatory Visit: Payer: Self-pay | Admitting: Family Medicine

## 2023-03-21 DIAGNOSIS — B20 Human immunodeficiency virus [HIV] disease: Secondary | ICD-10-CM

## 2023-04-06 ENCOUNTER — Ambulatory Visit (INDEPENDENT_AMBULATORY_CARE_PROVIDER_SITE_OTHER): Payer: Managed Care, Other (non HMO) | Admitting: Family Medicine

## 2023-04-06 ENCOUNTER — Encounter: Payer: Self-pay | Admitting: Family Medicine

## 2023-04-06 VITALS — BP 156/110 | HR 74 | Resp 18 | Ht 69.5 in | Wt 270.0 lb

## 2023-04-06 DIAGNOSIS — E237 Disorder of pituitary gland, unspecified: Secondary | ICD-10-CM

## 2023-04-06 DIAGNOSIS — Z0000 Encounter for general adult medical examination without abnormal findings: Secondary | ICD-10-CM

## 2023-04-06 DIAGNOSIS — J301 Allergic rhinitis due to pollen: Secondary | ICD-10-CM

## 2023-04-06 DIAGNOSIS — E291 Testicular hypofunction: Secondary | ICD-10-CM | POA: Diagnosis not present

## 2023-04-06 DIAGNOSIS — I10 Essential (primary) hypertension: Secondary | ICD-10-CM

## 2023-04-06 DIAGNOSIS — B20 Human immunodeficiency virus [HIV] disease: Secondary | ICD-10-CM

## 2023-04-06 DIAGNOSIS — E669 Obesity, unspecified: Secondary | ICD-10-CM

## 2023-04-06 DIAGNOSIS — F320 Major depressive disorder, single episode, mild: Secondary | ICD-10-CM

## 2023-04-06 DIAGNOSIS — Z1211 Encounter for screening for malignant neoplasm of colon: Secondary | ICD-10-CM

## 2023-04-06 DIAGNOSIS — F32A Depression, unspecified: Secondary | ICD-10-CM

## 2023-04-06 DIAGNOSIS — Z23 Encounter for immunization: Secondary | ICD-10-CM

## 2023-04-06 MED ORDER — FLUOXETINE HCL 20 MG PO CAPS
ORAL_CAPSULE | ORAL | 3 refills | Status: DC
Start: 2023-04-06 — End: 2024-04-09

## 2023-04-06 MED ORDER — LOSARTAN POTASSIUM-HCTZ 100-12.5 MG PO TABS
1.0000 | ORAL_TABLET | Freq: Every day | ORAL | 3 refills | Status: DC
Start: 2023-04-06 — End: 2024-04-09

## 2023-04-06 MED ORDER — BIKTARVY 50-200-25 MG PO TABS
1.0000 | ORAL_TABLET | Freq: Every day | ORAL | 0 refills | Status: DC
Start: 2023-04-06 — End: 2023-07-18

## 2023-04-06 MED ORDER — TESTOSTERONE 1.62 % TD GEL
TRANSDERMAL | 5 refills | Status: DC
Start: 2023-04-06 — End: 2023-10-31

## 2023-04-06 NOTE — Progress Notes (Signed)
Complete physical exam  Patient: Patrick Holmes   DOB: [birth year]   57 y.o. Male  MRN: [id number]  Subjective:    Chief Complaint  Patient presents with   Annual Exam    Fasting CPE.     Patrick Holmes is a 57 y.o. male who presents today for a complete physical exam.  He reports consuming a general diet. Home exercise routine includes walking 6 days per week. He generally feels fairly well. He reports sleeping fairly well.  He has not been on blood pressure medications stating that he never really picked it up from the drugstore and did not know he was supposed to be on them.  He continues on Prozac which helps keep him psychologically stable.  Continues on [place] and is having no difficulty with that.  He is also using testosterone gel.  His allergies seem to be under good control.  His weight is unchanged.  Work and home life are going fairly well although his partner's parents have moved to the area which does change the dynamics.  Most recent fall risk assessment:    04/06/2023    8:07 AM  Fall Risk   Falls in the past year? 0  Number falls in past yr: 0  Injury with Fall? 0  Risk for fall due to : No Fall Risks  Follow up Falls evaluation completed     Most recent depression screenings:    04/06/2023    8:07 AM 04/04/2022    3:21 PM  PHQ 2/9 Scores  PHQ - 2 Score 0 0  PHQ- 9 Score 1     Vision:Within last year and Dental: Receives regular dental care    Immunization History  Administered Date(s) Administered   Hepatitis A 12/16/2005   Hepatitis B 01/02/2012   Influenza,inj,Quad PF,6+ Mos 05/14/2013, 05/13/2014, 06/09/2015, 05/03/2016, 04/20/2017, 09/05/2018, 10/02/2019, 05/03/2021   PFIZER(Purple Top)SARS-COV-2 Vaccination 10/30/2019, 11/20/2019, 07/28/2020   Pfizer Covid-19 Vaccine Bivalent Booster 18yrs & up 08/06/2021   Pneumococcal Conjugate-13 12/31/2013   Pneumococcal Polysaccharide-23 06/17/2005   Tdap 12/31/2013    Health Maintenance  Topic Date Due    Zoster Vaccines- Shingrix (1 of 2) Never done   COVID-19 Vaccine (5 - 2023-24 season) 04/22/2022   Fecal DNA (Cologuard)  12/24/2022   INFLUENZA VACCINE  03/23/2023   DTaP/Tdap/Td (2 - Td or Tdap) 01/01/2024   Hepatitis C Screening  Completed   HIV Screening  Completed   HPV VACCINES  Aged Out    Patient Care Team: Ronnald Nian, MD as PCP - General (Family Medicine)   Outpatient Medications Prior to Visit  Medication Sig Note   levocetirizine (XYZAL) 5 MG tablet Take 5 mg by mouth every evening.    ketoconazole (NIZORAL) 2 % shampoo Apply topically once a week. (Patient not taking: Reported on 03/22/2021)    [DISCONTINUED] bictegravir-emtricitabine-tenofovir AF (BIKTARVY) 50-200-25 MG TABS tablet TAKE 1 TABLET BY MOUTH DAILY    [DISCONTINUED] FLUoxetine (PROZAC) 20 MG capsule TAKE 1 CAPSULE(20 MG) BY MOUTH DAILY    [DISCONTINUED] losartan-hydrochlorothiazide (HYZAAR) 100-12.5 MG tablet Take 1 tablet by mouth daily. (Patient not taking: Reported on 04/06/2023) 04/06/2023: Never started taking   [DISCONTINUED] Testosterone 1.62 % GEL APPLY 2 PUMPS TOPICALLY TO THE AFFECTED AREA DAILY    No facility-administered medications prior to visit.    Review of Systems  All other systems reviewed and are negative.   Family and social history as well as health maintenance and immunizations was reviewed.  Objective:    BP (!) 156/110 (BP Location: Right Arm, Cuff Size: Large)   Pulse 74   Resp 18   Ht 5' 9.5" (1.765 m)   Wt 270 lb (122.5 kg)   SpO2 94% Comment: room air  BMI 39.30 kg/m    Physical Exam  Alert and in no distress. Tympanic membranes and canals are normal. Pharyngeal area is normal. Neck is supple without adenopathy or thyromegaly. Cardiac exam shows a regular sinus rhythm without murmurs or gallops. Lungs are clear to auscultation. Blood pressure is recorded.     Assessment & Plan:     Routine general medical examination at a health care facility - Plan: CBC with  Differential/Platelet, Comprehensive metabolic panel, Lipid panel  Seasonal allergic rhinitis due to pollen  Current mild episode of major depressive disorder without prior episode (HCC)  Human immunodeficiency virus (HIV) disease (HCC) - Plan: bictegravir-emtricitabine-tenofovir AF (BIKTARVY) 50-200-25 MG TABS tablet, CBC with Differential/Platelet, Comprehensive metabolic panel, HIV-1 RNA quant-no reflex-bld, Lipid panel, T-helper cells (CD4) count (not at [facility])  Hypogonadism male - Plan: Testosterone 1.62 % GEL, Testosterone  Lesion of pituitary gland (HCC)  Obesity (BMI 30-39.9)  Depression, unspecified depression type - Plan: FLUoxetine (PROZAC) 20 MG capsule  Primary hypertension - Plan: losartan-hydrochlorothiazide (HYZAAR) 100-12.5 MG tablet, CBC with Differential/Platelet, Comprehensive metabolic panel  Screening for colon cancer - Plan: Cologuard  Need for shingles vaccine - Plan: Varicella-zoster vaccine IM   Return in about 1 month (around 05/07/2023). He will return here in 1 month for blood pressure recheck and also get the shingles shot at that time.     Sharlot Gowda, MD

## 2023-04-07 LAB — COMPREHENSIVE METABOLIC PANEL
ALT: 32 IU/L (ref 0–44)
AST: 18 IU/L (ref 0–40)
Albumin: 4.3 g/dL (ref 3.8–4.9)
Alkaline Phosphatase: 62 IU/L (ref 44–121)
BUN/Creatinine Ratio: 9 (ref 9–20)
BUN: 9 mg/dL (ref 6–24)
Bilirubin Total: 1.9 mg/dL — ABNORMAL HIGH (ref 0.0–1.2)
CO2: 25 mmol/L (ref 20–29)
Calcium: 8.9 mg/dL (ref 8.7–10.2)
Chloride: 104 mmol/L (ref 96–106)
Creatinine, Ser: 0.98 mg/dL (ref 0.76–1.27)
Globulin, Total: 2.3 g/dL (ref 1.5–4.5)
Glucose: 135 mg/dL — ABNORMAL HIGH (ref 70–99)
Potassium: 3.6 mmol/L (ref 3.5–5.2)
Sodium: 140 mmol/L (ref 134–144)
Total Protein: 6.6 g/dL (ref 6.0–8.5)
eGFR: 91 mL/min/{1.73_m2} (ref >59)

## 2023-04-07 LAB — TESTOSTERONE: Testosterone: 621 ng/dL (ref 264–916)

## 2023-04-07 LAB — T-HELPER CELLS (CD4) COUNT (NOT AT ARMC)
% CD 4 Pos. Lymph.: 36.6 % (ref 30.8–58.5)
Absolute CD 4 Helper: 586 /uL (ref 359–1519)
Basophils Absolute: 0 10*3/uL (ref 0.0–0.2)
Basos: 1 %
EOS (ABSOLUTE): 0.1 10*3/uL (ref 0.0–0.4)
Eos: 1 %
Hematocrit: 46.6 % (ref 37.5–51.0)
Hemoglobin: 16 g/dL (ref 13.0–17.7)
Immature Grans (Abs): 0 10*3/uL (ref 0.0–0.1)
Immature Granulocytes: 0 %
Lymphocytes Absolute: 1.6 10*3/uL (ref 0.7–3.1)
Lymphs: 37 %
MCH: 32.7 pg (ref 26.6–33.0)
MCHC: 34.3 g/dL (ref 31.5–35.7)
MCV: 95 fL (ref 79–97)
Monocytes Absolute: 0.3 10*3/uL (ref 0.1–0.9)
Monocytes: 7 %
Neutrophils Absolute: 2.3 10*3/uL (ref 1.4–7.0)
Neutrophils: 54 %
Platelets: 170 10*3/uL (ref 150–450)
RBC: 4.9 x10E6/uL (ref 4.14–5.80)
RDW: 14.2 % (ref 11.6–15.4)
WBC: 4.2 10*3/uL (ref 3.4–10.8)

## 2023-04-07 LAB — HIV-1 RNA QUANT-NO REFLEX-BLD: HIV-1 RNA Viral Load: <20 {copies}/mL

## 2023-04-07 LAB — LIPID PANEL
Chol/HDL Ratio: 3.1 ratio (ref 0.0–5.0)
Cholesterol, Total: 152 mg/dL (ref 100–199)
HDL: 49 mg/dL (ref >39)
LDL Chol Calc (NIH): 89 mg/dL (ref 0–99)
Triglycerides: 71 mg/dL (ref 0–149)
VLDL Cholesterol Cal: 14 mg/dL (ref 5–40)

## 2023-04-19 ENCOUNTER — Other Ambulatory Visit: Payer: Self-pay | Admitting: Family Medicine

## 2023-04-19 DIAGNOSIS — F32A Depression, unspecified: Secondary | ICD-10-CM

## 2023-05-04 LAB — SPECIMEN STATUS REPORT

## 2023-05-04 LAB — HGB A1C W/O EAG: Hgb A1c MFr Bld: 5.2 % (ref 4.8–5.6)

## 2023-05-25 ENCOUNTER — Ambulatory Visit (INDEPENDENT_AMBULATORY_CARE_PROVIDER_SITE_OTHER): Payer: Managed Care, Other (non HMO) | Admitting: Family Medicine

## 2023-05-25 ENCOUNTER — Encounter: Payer: Self-pay | Admitting: Family Medicine

## 2023-05-25 VITALS — BP 148/98 | HR 73 | Wt 274.0 lb

## 2023-05-25 DIAGNOSIS — Z23 Encounter for immunization: Secondary | ICD-10-CM

## 2023-05-25 DIAGNOSIS — M79605 Pain in left leg: Secondary | ICD-10-CM

## 2023-05-25 DIAGNOSIS — I10 Essential (primary) hypertension: Secondary | ICD-10-CM | POA: Diagnosis not present

## 2023-05-25 MED ORDER — AMLODIPINE BESYLATE 5 MG PO TABS
5.0000 mg | ORAL_TABLET | Freq: Every day | ORAL | 3 refills | Status: DC
Start: 2023-05-25 — End: 2024-06-18

## 2023-05-25 NOTE — Progress Notes (Signed)
   Subjective:    Patient ID: Patrick Holmes, male    DOB: [birth year], 57 y.o.   MRN: [id number]  HPI For blood pressure recheck and also immunization update.  He also states that for the last 3 years he has noted some left upper thigh numbness and now also pain in the left lateral calf area.  If he lifts his right leg he will make his left calf pain worse.  Also whenever he moves his left leg he will feel pain.  Pain is also made worse with walking whether when he leans forward it does relieve the pain.   Review of Systems     Objective:    Physical Exam Alert and in no distress.  No palpable tenderness to his lumbar or SI joint area.  FABER testing was difficult to do due to pain.  Negative straight leg raising.  DTRs are normal.       Assessment & Plan:  Primary hypertension - Plan: amLODipine (NORVASC) 5 MG tablet  Need for shingles vaccine - Plan: Zoster Recombinant (Shingrix ), CANCELED: Varicella-zoster vaccine IM  Need for influenza vaccination - Plan: Flu vaccine trivalent PF, 6mos and older(Flulaval,Afluria,Fluarix,Fluzone)  Pain of left lower extremity - Plan: DG Lumbar Spine Complete I will add amlodipine to his hypertensive regimen.  Discussed possibly getting a blood pressure cuff and bring it with him in a month for recheck. I will send for x-rays and if negative, refer to orthopedics as this does seem like it could be spinal stenosis causing his symptoms.

## 2023-05-25 NOTE — Patient Instructions (Addendum)
Check into the DASH diet and I will add another medication to your regimen.  To be getting a blood pressure cuff and bring it with you when you return here in 1 month. Omron cuff

## 2023-06-02 ENCOUNTER — Encounter: Payer: Self-pay | Admitting: Family Medicine

## 2023-06-02 DIAGNOSIS — M79605 Pain in left leg: Secondary | ICD-10-CM

## 2023-06-07 ENCOUNTER — Ambulatory Visit
Admission: RE | Admit: 2023-06-07 | Discharge: 2023-06-07 | Disposition: A | Discharge status: Home / Self Care | Payer: Managed Care, Other (non HMO) | Source: Ambulatory Visit | Attending: Family Medicine | Admitting: Family Medicine

## 2023-06-13 NOTE — Addendum Note (Signed)
Addended by: Ronnald Nian on: 06/13/2023 05:20 PM   Modules accepted: Orders

## 2023-06-20 ENCOUNTER — Encounter: Payer: Self-pay | Admitting: Physical Medicine and Rehabilitation

## 2023-06-20 ENCOUNTER — Ambulatory Visit (INDEPENDENT_AMBULATORY_CARE_PROVIDER_SITE_OTHER): Payer: Managed Care, Other (non HMO) | Admitting: Physical Medicine and Rehabilitation

## 2023-06-20 ENCOUNTER — Telehealth: Payer: Self-pay | Admitting: Family Medicine

## 2023-06-20 VITALS — BP 124/88 | HR 80

## 2023-06-20 DIAGNOSIS — R202 Paresthesia of skin: Secondary | ICD-10-CM | POA: Diagnosis not present

## 2023-06-20 DIAGNOSIS — G8929 Other chronic pain: Secondary | ICD-10-CM | POA: Diagnosis not present

## 2023-06-20 DIAGNOSIS — M5442 Lumbago with sciatica, left side: Secondary | ICD-10-CM | POA: Diagnosis not present

## 2023-06-20 DIAGNOSIS — M5416 Radiculopathy, lumbar region: Secondary | ICD-10-CM | POA: Diagnosis not present

## 2023-06-20 MED ORDER — MELOXICAM 15 MG PO TABS: 15.0000 mg | ORAL_TABLET | Freq: Every day | ORAL | 0 refills | Status: DC

## 2023-06-20 NOTE — Progress Notes (Unsigned)
Patrick Holmes - 57 y.o. male MRN [id number]  Date of birth: [birth year]  Office Visit Note: Visit Date: 06/20/2023 PCP: Ronnald Nian, MD Referred by: Ronnald Nian, MD  Subjective: Chief Complaint  Patient presents with   Lower Back - Pain   Left Leg - Pain, Numbness, Tingling   HPI: Patrick Holmes is a 57 y.o. male who comes in today per the request of Dr. Sharlot Gowda for evaluation of chronic, worsening and severe bilateral lower back pain radiating to left lateral leg down to ankle. Also reports tingling/numbness sensation to left upper thigh. Pain ongoing for several years, worsens with standing and walking. Severe pain when laying flat. He describes pain as sore and aching sensation, states his pain can range from 3 to 10 most days. Some relief of pain with home exercise regimen, rest and use of medications. He does attend sessions at [facility] and massage therapy at [facility] weekly. Recent lumbar radiographs exhibit normal anatomical alignment, no spondylolisthesis, mild disc height loss at L3-L4 through L5-S1. No history of lumbar spine surgery/injections. States he is very active, does play softball, states his pain has become so severe he is not able to run. Patient denies focal weakness. No recent trauma or falls.     Oswestry Disability Index Score 20% 10 to 20 (40%) moderate disability: The patient experiences more pain and difficulty with sitting, lifting and standing. Travel and social life are more difficult, and they may be disabled from work. Personal care, sexual activity and sleeping are not grossly affected, and the patient can usually be managed by conservative means.  Review of Systems  Musculoskeletal:  Positive for back pain.  Neurological:  Positive for tingling. Negative for focal weakness and weakness.  All other systems reviewed and are negative.  Otherwise per HPI.  Assessment & Plan: Visit Diagnoses:    ICD-10-CM   1. Chronic bilateral low back  pain with left-sided sciatica  M54.42 MR LUMBAR SPINE WO CONTRAST   G89.29     2. Lumbar radiculopathy  M54.16 MR LUMBAR SPINE WO CONTRAST    3. Paresthesia of skin  R20.2 MR LUMBAR SPINE WO CONTRAST       Plan: Findings:  Chronic, worsening and severe bilateral lower back pain radiating to left lateral leg down to ankle. Patient continues to have severe pain despite good conservative therapies such as home exercise regimen, rest and use of medications. Patients clinical presentation and exam are consistent with L5 nerve pattern. Next step is to obtain lumbar MRI imaging. Depending on results of MRI imaging we discussed possibility of performing lumbar epidural steroid injection. I discussed injection procedure in detail today, he has no questions at this time. I encouraged patient to remain active as tolerated, could also look at course of formal physical therapy. I also discussed medication management and prescribed short course of Meloxicam. I will see him back for lumbar MRI review and to discuss treatment options. No red flag symptoms noted upon exam today.     Meds & Orders:  Meds ordered this encounter  Medications   meloxicam (MOBIC) 15 MG tablet    Sig: Take 1 tablet (15 mg total) by mouth daily.    Dispense:  30 tablet    Refill:  0    Orders Placed This Encounter  Procedures   MR LUMBAR SPINE WO CONTRAST    Follow-up: Return for lumbar MRI review.   Procedures: No procedures performed      Clinical History:  Narrative & Impression CLINICAL DATA:  Chronic lower back pain.   EXAM: LUMBAR SPINE - COMPLETE 4+ VIEW   COMPARISON:  None Available.   FINDINGS: Five lumbar type vertebral bodies.   No acute fracture or subluxation. Vertebral body heights are preserved.   Alignment is normal.   Mild disc height loss from L3-L4 through L5-S1.   Sacroiliac joints are unremarkable.   IMPRESSION: 1. Mild lower lumbar spondylosis.     Electronically Signed   By:  Obie Dredge M.D.   On: 06/12/2023 14:37   He reports that he has never smoked. He has never used smokeless tobacco.  Recent Labs    04/06/23 0907  HGBA1C 5.2    Objective:  VS:  HT:    WT:   BMI:     BP:124/88  HR:80bpm  TEMP: ( )  RESP:  Physical Exam Vitals and nursing note reviewed.  HENT:     Head: Normocephalic and atraumatic.     Right Ear: External ear normal.     Left Ear: External ear normal.     Nose: Nose normal.     Mouth/Throat:     Mouth: Mucous membranes are moist.  Eyes:     Extraocular Movements: Extraocular movements intact.  Cardiovascular:     Rate and Rhythm: Normal rate.     Pulses: Normal pulses.  Pulmonary:     Effort: Pulmonary effort is normal.  Abdominal:     General: Abdomen is flat. There is no distension.  Musculoskeletal:        General: Tenderness present.     Cervical back: Normal range of motion.     Comments: Patient rises from seated position to standing without difficulty. Good lumbar range of motion. No pain noted with facet loading. 5/5 strength noted with bilateral hip flexion, knee flexion/extension, ankle dorsiflexion/plantarflexion and EHL. No clonus noted bilaterally. No pain upon palpation of greater trochanters. No pain with internal/external rotation of bilateral hips. Sensation intact bilaterally. Dysesthesias noted to left L5 dermatome. Negative slump test bilaterally. Ambulates without aid, gait steady.     Skin:    General: Skin is warm and dry.     Capillary Refill: Capillary refill takes less than 2 seconds.  Neurological:     General: No focal deficit present.     Mental Status: He is alert and oriented to person, place, and time.  Psychiatric:        Mood and Affect: Mood normal.        Behavior: Behavior normal.     Ortho Exam  Imaging: No results found.  Past Medical/Family/Surgical/Social History: Medications & Allergies reviewed per EMR, new medications updated. Patient Active Problem List    Diagnosis Date Noted   Lesion of pituitary gland (HCC) 04/04/2022   ACE-inhibitor cough 03/22/2021   Allergic rhinitis due to pollen 01/06/2015   Depression 12/31/2013   Human immunodeficiency virus (HIV) disease (HCC) 12/31/2013   Obesity (BMI 30-39.9) 12/31/2013   Hypogonadism male 12/31/2013   Past Medical History:  Diagnosis Date   Allergy    Anxiety    Dental bridge present    Depression 2008   HIV disease (HCC) 2006   Wears contact lenses    Family History  Problem Relation Age of Onset   Heart disease Maternal Aunt    Esophageal cancer Maternal Aunt    Heart disease Maternal Grandmother    Heart disease Maternal Grandfather    Colon cancer Neg Hx    Colon polyps Neg  Hx    Stomach cancer Neg Hx    Rectal cancer Neg Hx    Past Surgical History:  Procedure Laterality Date   WISDOM TOOTH EXTRACTION     Social History   Occupational History   Not on file  Tobacco Use   Smoking status: Never   Smokeless tobacco: Never  Vaping Use   Vaping status: Never Used  Substance and Sexual Activity   Alcohol use: Yes    Comment: 2 times monthly   Drug use: No   Sexual activity: Not Currently

## 2023-06-20 NOTE — Progress Notes (Unsigned)
Functional Pain Scale - descriptive words and definitions  Uncomfortable (3)  Pain is present but can complete all ADL's/sleep is slightly affected and passive distraction only gives marginal relief. Mild range order  Average Pain 3  Pain in lower back and left leg x 2 yrs. Unable to run, walking has slowed down. Numbness, tingling, shooting pain in back and leg. Pain can get to a 10.

## 2023-06-20 NOTE — Telephone Encounter (Signed)
Pharmacy called to clarify bp med rx  Amlodipine  is that added to losartan hydrochlorothiazide or in place of

## 2023-06-20 NOTE — Telephone Encounter (Signed)
Notified pharmacy to add per OV notes on 05/25/23.

## 2023-06-26 ENCOUNTER — Other Ambulatory Visit: Payer: Managed Care, Other (non HMO)

## 2023-06-27 ENCOUNTER — Ambulatory Visit: Payer: Managed Care, Other (non HMO) | Admitting: Family Medicine

## 2023-07-18 ENCOUNTER — Other Ambulatory Visit: Payer: Self-pay | Admitting: Family Medicine

## 2023-07-18 ENCOUNTER — Other Ambulatory Visit: Payer: Self-pay | Admitting: Physical Medicine and Rehabilitation

## 2023-07-18 DIAGNOSIS — B20 Human immunodeficiency virus [HIV] disease: Secondary | ICD-10-CM

## 2023-07-18 NOTE — Telephone Encounter (Signed)
Is this okay to refill? 

## 2023-08-02 NOTE — Telephone Encounter (Signed)
Patrick Holmes taking care of this

## 2023-08-15 ENCOUNTER — Other Ambulatory Visit: Payer: Self-pay | Admitting: Physical Medicine and Rehabilitation

## 2023-09-18 ENCOUNTER — Other Ambulatory Visit: Payer: Self-pay | Admitting: Physical Medicine and Rehabilitation

## 2023-09-18 MED ORDER — MELOXICAM 15 MG PO TABS: 15.0000 mg | ORAL_TABLET | Freq: Every day | ORAL | 0 refills | Status: DC

## 2023-09-22 ENCOUNTER — Encounter: Payer: Self-pay | Admitting: Physical Medicine and Rehabilitation

## 2023-09-26 ENCOUNTER — Other Ambulatory Visit: Payer: Managed Care, Other (non HMO)

## 2023-10-02 ENCOUNTER — Ambulatory Visit
Admission: RE | Admit: 2023-10-02 | Discharge: 2023-10-02 | Disposition: A | Discharge status: Home / Self Care | Payer: Managed Care, Other (non HMO) | Source: Ambulatory Visit | Attending: Physical Medicine and Rehabilitation | Admitting: Physical Medicine and Rehabilitation

## 2023-10-02 DIAGNOSIS — M5416 Radiculopathy, lumbar region: Secondary | ICD-10-CM

## 2023-10-02 DIAGNOSIS — G8929 Other chronic pain: Secondary | ICD-10-CM

## 2023-10-02 DIAGNOSIS — R202 Paresthesia of skin: Secondary | ICD-10-CM

## 2023-10-09 ENCOUNTER — Telehealth: Payer: Self-pay | Admitting: Physical Medicine and Rehabilitation

## 2023-10-09 NOTE — Telephone Encounter (Signed)
Patient called needing to reschedule his appointment. The number to contact patient is [phone]

## 2023-10-13 ENCOUNTER — Ambulatory Visit: Payer: Managed Care, Other (non HMO) | Admitting: Physical Medicine and Rehabilitation

## 2023-10-13 ENCOUNTER — Encounter: Payer: Self-pay | Admitting: Physical Medicine and Rehabilitation

## 2023-10-13 DIAGNOSIS — M48061 Spinal stenosis, lumbar region without neurogenic claudication: Secondary | ICD-10-CM

## 2023-10-13 DIAGNOSIS — M5442 Lumbago with sciatica, left side: Secondary | ICD-10-CM | POA: Diagnosis not present

## 2023-10-13 DIAGNOSIS — M5416 Radiculopathy, lumbar region: Secondary | ICD-10-CM

## 2023-10-13 DIAGNOSIS — M47816 Spondylosis without myelopathy or radiculopathy, lumbar region: Secondary | ICD-10-CM | POA: Diagnosis not present

## 2023-10-13 DIAGNOSIS — G8929 Other chronic pain: Secondary | ICD-10-CM

## 2023-10-13 DIAGNOSIS — M4800 Spinal stenosis, site unspecified: Secondary | ICD-10-CM

## 2023-10-13 NOTE — Progress Notes (Signed)
Patrick Holmes - 58 y.o. male MRN [id number]  Date of birth: November 19, 1965  Office Visit Note: Visit Date: 10/13/2023 PCP: Ronnald Nian, MD Referred by: Ronnald Nian, MD  Subjective: Chief Complaint  Patient presents with   Lower Back - Pain   HPI: Patrick Holmes is a 58 y.o. male who comes in today for evaluation of chronic, worsening and severe bilateral lower back pain radiating down left lateral leg to foot. Pain ongoing for several years, worsens with standing and walking. He describes his pain as sore and dull sensation, currently rates as 3 out of 10. Some relief of pain with home exercise regimen, rest and use of medications. He does attend [facility] and perform yoga multiple times a week with some relief of pain. He also attends massage therapy at [facility] about once a month. Recent lumbar MRI imaging shows moderate multifactorial spinal and bilateral lateral recess stenosis at L4-5, left greater than right potentially affecting the left L5 nerve root.       Review of Systems  Musculoskeletal:  Positive for back pain.  Neurological:  Negative for tingling, sensory change, focal weakness and weakness.  All other systems reviewed and are negative.  Otherwise per HPI.  Assessment & Plan: Visit Diagnoses:    ICD-10-CM   1. Chronic bilateral low back pain with left-sided sciatica  M54.42 Ambulatory referral to Physical Medicine Rehab   G89.29     2. Lumbar radiculopathy  M54.16 Ambulatory referral to Physical Medicine Rehab    3. Facet arthropathy, lumbar  M47.816 Ambulatory referral to Physical Medicine Rehab    4. Stenosis of lateral recess of lumbar spine  M48.061 Ambulatory referral to Physical Medicine Rehab    5. Central stenosis of spinal canal  M48.00 Ambulatory referral to Physical Medicine Rehab       Plan: Findings:  Chronic, worsening and severe bilateral lower back pain radiating down left lateral leg to foot. Patient continues to have severe pain  despite good conservative therapies such as home exercise regimen, rest and use of medications. I discussed recent lumbar MRI with him today using imaging and spine model. Patients clinical presentation and exam is complex, his pain pattern does fit with more of L5 distribution. There is moderate canal stenosis and bilateral lateral recess stenosis at L4-L5. We discussed treatment plan in detail today. Next step is to perform diagnostic and hopefully therapeutic left L4-L5 interlaminar epidural steroid injection under fluoroscopic guidance. I discussed injection procedure with patient in detail today, he has no questions at this time. I instructed him to continue with Meloxicam daily until we are able to get him in for injection. Dr. Alvester Morin at bedside as well to discuss plan and answer any questions. Should lower back pain remain post injection we would consider performing diagnostic facet joint injections. No red flag symptoms noted upon exam today.     Meds & Orders: No orders of the defined types were placed in this encounter.   Orders Placed This Encounter  Procedures   Ambulatory referral to Physical Medicine Rehab    Follow-up: Return for Left L4-L5 interlaminar epidural steroid injection.   Procedures: No procedures performed      Clinical History: CLINICAL DATA:  Low back and left lower extremity pain and numbness for 2 years.   EXAM: MRI LUMBAR SPINE WITHOUT CONTRAST   TECHNIQUE: Multiplanar, multisequence MR imaging of the lumbar spine was performed. No intravenous contrast was administered.   COMPARISON:  Lumbar spine radiographs 06/07/2023  FINDINGS: Segmentation: There are five lumbar type vertebral bodies. The last full intervertebral disc space is labeled L5-S1. This correlates with the lumbar radiographs   Alignment:  Normal   Vertebrae: Normal marrow signal. No bone lesions or fractures. T12 Schmorl's node and L2 hemangioma noted.   Conus medullaris and cauda  equina: Conus extends to the L1 level. Conus and cauda equina appear normal.   Paraspinal and other soft tissues: No significant paraspinal retroperitoneal findings.   Disc levels:   T12-L1 no significant findings.   L1-2: No significant findings.   L2-3: Mild facet degenerative changes but no disc protrusions, spinal foraminal stenosis.   L3-4: Mild facet disease but no disc protrusions, spinal or foraminal stenosis.   L4-5: Bulging annulus, central disc protrusion, short pedicles, facet disease and ligamentum flavum thickening contributing to moderate spinal and bilateral lateral recess stenosis, left greater than right potentially affecting the left L5 nerve root. The exiting L4 nerve roots appear normal.   L5-S1: Facet disease and shallow central disc protrusion but no significant spinal foraminal stenosis. There are also small bilateral synovial cysts projecting posteriorly.   IMPRESSION: 1. Moderate multifactorial spinal and bilateral lateral recess stenosis at L4-5, left greater than right potentially affecting the left L5 nerve root. 2. Facet disease and shallow central disc protrusion at L5-S1 but no significant spinal or foraminal stenosis.     Electronically Signed   By: Rudie Meyer M.D.   On: 10/10/2023 09:50   He reports that he has never smoked. He has never used smokeless tobacco.  Recent Labs    04/06/23 0907  HGBA1C 5.2    Objective:  VS:  HT:    WT:   BMI:     BP:   HR: bpm  TEMP: ( )  RESP:  Physical Exam Vitals and nursing note reviewed.  HENT:     Head: Normocephalic and atraumatic.     Right Ear: External ear normal.     Left Ear: External ear normal.     Nose: Nose normal.     Mouth/Throat:     Mouth: Mucous membranes are moist.  Eyes:     Extraocular Movements: Extraocular movements intact.  Cardiovascular:     Rate and Rhythm: Normal rate.     Pulses: Normal pulses.  Pulmonary:     Effort: Pulmonary effort is normal.   Abdominal:     General: Abdomen is flat. There is no distension.  Musculoskeletal:        General: Tenderness present.     Cervical back: Normal range of motion.     Comments: Patient rises from seated position to standing without difficulty. Good lumbar range of motion. No pain noted with facet loading. 5/5 strength noted with bilateral hip flexion, knee flexion/extension, ankle dorsiflexion/plantarflexion and EHL. No clonus noted bilaterally. No pain upon palpation of greater trochanters. No pain with internal/external rotation of bilateral hips. Sensation intact bilaterally. Dysesthesias noted to left L5 dermatome. Negative slump test bilaterally. Ambulates without aid, gait steady.     Skin:    General: Skin is warm and dry.     Capillary Refill: Capillary refill takes less than 2 seconds.  Neurological:     General: No focal deficit present.     Mental Status: He is alert and oriented to person, place, and time.  Psychiatric:        Mood and Affect: Mood normal.        Behavior: Behavior normal.     Ortho Exam  Imaging: No results found.  Past Medical/Family/Surgical/Social History: Medications & Allergies reviewed per EMR, new medications updated. 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   Allergic rhinitis due to pollen 01/06/2015   Depression 12/31/2013   Human immunodeficiency virus (HIV) disease (HCC) 12/31/2013   Obesity (BMI 30-39.9) 12/31/2013   Hypogonadism male 12/31/2013   Past Medical History:  Diagnosis Date   Allergy    Anxiety    Dental bridge present    Depression 2008   HIV disease (HCC) 2006   Wears contact lenses    Family History  Problem Relation Age of Onset   Heart disease Maternal Aunt    Esophageal cancer Maternal Aunt    Heart disease Maternal Grandmother    Heart disease Maternal Grandfather    Colon cancer Neg Hx    Colon polyps Neg Hx     Stomach cancer Neg Hx    Rectal cancer Neg Hx    Past Surgical History:  Procedure Laterality Date   WISDOM TOOTH EXTRACTION     Social History   Occupational History   Not on file  Tobacco Use   Smoking status: Never   Smokeless tobacco: Never  Vaping Use   Vaping status: Never Used  Substance and Sexual Activity   Alcohol use: Yes    Comment: 2 times monthly   Drug use: No   Sexual activity: Not Currently

## 2023-10-13 NOTE — Progress Notes (Signed)
 Pain Score 3

## 2023-10-17 ENCOUNTER — Encounter: Payer: Self-pay | Admitting: Internal Medicine

## 2023-10-24 ENCOUNTER — Ambulatory Visit: Payer: Managed Care, Other (non HMO) | Admitting: Physical Medicine and Rehabilitation

## 2023-10-24 ENCOUNTER — Other Ambulatory Visit: Payer: Self-pay

## 2023-10-24 VITALS — BP 163/104 | HR 75

## 2023-10-24 DIAGNOSIS — M5416 Radiculopathy, lumbar region: Secondary | ICD-10-CM | POA: Diagnosis not present

## 2023-10-24 MED ORDER — METHYLPREDNISOLONE ACETATE 40 MG/ML IJ SUSP
40.0000 mg | Freq: Once | INTRAMUSCULAR | Status: AC
  Administered 2023-10-24: 40 mg

## 2023-10-24 NOTE — Patient Instructions (Signed)

## 2023-10-24 NOTE — Progress Notes (Unsigned)
 Pain Score-4 No Allergies to Contrast Dyes No Blood Thinners

## 2023-10-25 NOTE — Procedures (Signed)
 Lumbar Epidural Steroid Injection - Interlaminar Approach with Fluoroscopic Guidance  Patient: Patrick Holmes      Date of Birth: 1965/12/19 MRN: [id number] PCP: Ronnald Nian, MD      Visit Date: 10/24/2023   Universal Protocol:     Consent Given By: the patient  Position: PRONE  Additional Comments: Vital signs were monitored before and after the procedure. Patient was prepped and draped in the usual sterile fashion. The correct patient, procedure, and site was verified.   Injection Procedure Details:   Procedure diagnoses: Lumbar radiculopathy [M54.16]   Meds Administered:  Meds ordered this encounter  Medications   methylPREDNISolone acetate (DEPO-MEDROL) injection 40 mg     Laterality: Left  Location/Site:  L4-5  Needle: 4.5 in., 20 ga. Tuohy  Needle Placement: Paramedian epidural  Findings:   -Comments: Excellent flow of contrast into the epidural space.  Procedure Details: Using a paramedian approach from the side mentioned above, the region overlying the inferior lamina was localized under fluoroscopic visualization and the soft tissues overlying this structure were infiltrated with 4 ml. of 1% Lidocaine without Epinephrine. The Tuohy needle was inserted into the epidural space using a paramedian approach.   The epidural space was localized using loss of resistance along with counter oblique bi-planar fluoroscopic views.  After negative aspirate for air, blood, and CSF, a 2 ml. volume of Isovue-250 was injected into the epidural space and the flow of contrast was observed. Radiographs were obtained for documentation purposes.    The injectate was administered into the level noted above.   Additional Comments:  The patient tolerated the procedure well Dressing: 2 x 2 sterile gauze and Band-Aid    Post-procedure details: Patient was observed during the procedure. Post-procedure instructions were reviewed.  Patient left the clinic in stable condition.

## 2023-10-25 NOTE — Progress Notes (Signed)
 Patrick Holmes - 58 y.o. male MRN [id number]  Date of birth: 1966/04/11  Office Visit Note: Visit Date: 10/24/2023 PCP: Ronnald Nian, MD Referred by: Ronnald Nian, MD  Subjective: Chief Complaint  Patient presents with   Lower Back - Pain   HPI:  Patrick Holmes is a 59 y.o. male who comes in today at the request of Ellin Goodie, FNP for planned Left L4-5 Lumbar Interlaminar epidural steroid injection with fluoroscopic guidance.  The patient has failed conservative care including home exercise, medications, time and activity modification.  This injection will be diagnostic and hopefully therapeutic.  Please see requesting physician notes for further details and justification.   ROS Otherwise per HPI.  Assessment & Plan: Visit Diagnoses:    ICD-10-CM   1. Lumbar radiculopathy  M54.16 XR C-ARM NO REPORT    Epidural Steroid injection    methylPREDNISolone acetate (DEPO-MEDROL) injection 40 mg      Plan: No additional findings.   Meds & Orders:  Meds ordered this encounter  Medications   methylPREDNISolone acetate (DEPO-MEDROL) injection 40 mg    Orders Placed This Encounter  Procedures   XR C-ARM NO REPORT   Epidural Steroid injection    Follow-up: Return if symptoms worsen or fail to improve.   Procedures: No procedures performed  Lumbar Epidural Steroid Injection - Interlaminar Approach with Fluoroscopic Guidance  Patient: Patrick Holmes      Date of Birth: Apr 12, 1966 MRN: [id number] PCP: Ronnald Nian, MD      Visit Date: 10/24/2023   Universal Protocol:     Consent Given By: the patient  Position: PRONE  Additional Comments: Vital signs were monitored before and after the procedure. Patient was prepped and draped in the usual sterile fashion. The correct patient, procedure, and site was verified.   Injection Procedure Details:   Procedure diagnoses: Lumbar radiculopathy [M54.16]   Meds Administered:  Meds ordered this encounter  Medications    methylPREDNISolone acetate (DEPO-MEDROL) injection 40 mg     Laterality: Left  Location/Site:  L4-5  Needle: 4.5 in., 20 ga. Tuohy  Needle Placement: Paramedian epidural  Findings:   -Comments: Excellent flow of contrast into the epidural space.  Procedure Details: Using a paramedian approach from the side mentioned above, the region overlying the inferior lamina was localized under fluoroscopic visualization and the soft tissues overlying this structure were infiltrated with 4 ml. of 1% Lidocaine without Epinephrine. The Tuohy needle was inserted into the epidural space using a paramedian approach.   The epidural space was localized using loss of resistance along with counter oblique bi-planar fluoroscopic views.  After negative aspirate for air, blood, and CSF, a 2 ml. volume of Isovue-250 was injected into the epidural space and the flow of contrast was observed. Radiographs were obtained for documentation purposes.    The injectate was administered into the level noted above.   Additional Comments:  The patient tolerated the procedure well Dressing: 2 x 2 sterile gauze and Band-Aid    Post-procedure details: Patient was observed during the procedure. Post-procedure instructions were reviewed.  Patient left the clinic in stable condition.   Clinical History: CLINICAL DATA:  Low back and left lower extremity pain and numbness for 2 years.   EXAM: MRI LUMBAR SPINE WITHOUT CONTRAST   TECHNIQUE: Multiplanar, multisequence MR imaging of the lumbar spine was performed. No intravenous contrast was administered.   COMPARISON:  Lumbar spine radiographs 06/07/2023   FINDINGS: Segmentation: There are five lumbar type vertebral bodies. The  last full intervertebral disc space is labeled L5-S1. This correlates with the lumbar radiographs   Alignment:  Normal   Vertebrae: Normal marrow signal. No bone lesions or fractures. T12 Schmorl's node and L2 hemangioma noted.    Conus medullaris and cauda equina: Conus extends to the L1 level. Conus and cauda equina appear normal.   Paraspinal and other soft tissues: No significant paraspinal retroperitoneal findings.   Disc levels:   T12-L1 no significant findings.   L1-2: No significant findings.   L2-3: Mild facet degenerative changes but no disc protrusions, spinal foraminal stenosis.   L3-4: Mild facet disease but no disc protrusions, spinal or foraminal stenosis.   L4-5: Bulging annulus, central disc protrusion, short pedicles, facet disease and ligamentum flavum thickening contributing to moderate spinal and bilateral lateral recess stenosis, left greater than right potentially affecting the left L5 nerve root. The exiting L4 nerve roots appear normal.   L5-S1: Facet disease and shallow central disc protrusion but no significant spinal foraminal stenosis. There are also small bilateral synovial cysts projecting posteriorly.   IMPRESSION: 1. Moderate multifactorial spinal and bilateral lateral recess stenosis at L4-5, left greater than right potentially affecting the left L5 nerve root. 2. Facet disease and shallow central disc protrusion at L5-S1 but no significant spinal or foraminal stenosis.     Electronically Signed   By: Rudie Meyer M.D.   On: 10/10/2023 09:50     Objective:  VS:  HT:    WT:   BMI:     BP:(!) 163/104  HR:75bpm  TEMP: ( )  RESP:  Physical Exam Vitals and nursing note reviewed.  Constitutional:      General: He is not in acute distress.    Appearance: Normal appearance. He is obese. He is not ill-appearing.  HENT:     Head: Normocephalic and atraumatic.     Right Ear: External ear normal.     Left Ear: External ear normal.     Nose: No congestion.  Eyes:     Extraocular Movements: Extraocular movements intact.  Cardiovascular:     Rate and Rhythm: Normal rate.     Pulses: Normal pulses.  Pulmonary:     Effort: Pulmonary effort is normal. No  respiratory distress.  Abdominal:     General: There is no distension.     Palpations: Abdomen is soft.  Musculoskeletal:        General: No tenderness or signs of injury.     Cervical back: Neck supple.     Right lower leg: No edema.     Left lower leg: No edema.     Comments: Patient has good distal strength without clonus.  Skin:    Findings: No erythema or rash.  Neurological:     General: No focal deficit present.     Mental Status: He is alert and oriented to person, place, and time.     Sensory: No sensory deficit.     Motor: No weakness or abnormal muscle tone.     Coordination: Coordination normal.  Psychiatric:        Mood and Affect: Mood normal.        Behavior: Behavior normal.      Imaging: XR C-ARM NO REPORT Result Date: 10/24/2023 Please see Notes tab for imaging impression.

## 2023-10-30 ENCOUNTER — Other Ambulatory Visit: Payer: Self-pay | Admitting: Physical Medicine and Rehabilitation

## 2023-10-30 ENCOUNTER — Other Ambulatory Visit: Payer: Self-pay | Admitting: Family Medicine

## 2023-10-30 DIAGNOSIS — E291 Testicular hypofunction: Secondary | ICD-10-CM

## 2023-10-30 NOTE — Telephone Encounter (Signed)
 Is this okay to refill?

## 2023-10-30 NOTE — Telephone Encounter (Signed)
 Can you refill please, thanks.

## 2023-10-31 NOTE — Telephone Encounter (Signed)
 Please sign order

## 2023-11-10 ENCOUNTER — Ambulatory Visit: Payer: Managed Care, Other (non HMO) | Admitting: Physical Medicine and Rehabilitation

## 2023-12-11 ENCOUNTER — Other Ambulatory Visit: Payer: Self-pay | Admitting: Physical Medicine and Rehabilitation

## 2024-01-08 ENCOUNTER — Other Ambulatory Visit: Payer: Self-pay | Admitting: Family Medicine

## 2024-01-08 DIAGNOSIS — E291 Testicular hypofunction: Secondary | ICD-10-CM

## 2024-01-08 DIAGNOSIS — B20 Human immunodeficiency virus [HIV] disease: Secondary | ICD-10-CM

## 2024-01-08 NOTE — Telephone Encounter (Signed)
 Last apt 05/25/23

## 2024-02-05 ENCOUNTER — Other Ambulatory Visit: Payer: Self-pay | Admitting: Physical Medicine and Rehabilitation

## 2024-03-04 ENCOUNTER — Other Ambulatory Visit: Payer: Self-pay | Admitting: Physical Medicine and Rehabilitation

## 2024-03-04 ENCOUNTER — Other Ambulatory Visit: Payer: Self-pay | Admitting: Family Medicine

## 2024-03-04 DIAGNOSIS — E291 Testicular hypofunction: Secondary | ICD-10-CM

## 2024-03-04 NOTE — Telephone Encounter (Signed)
 Last apt 05/25/23

## 2024-04-08 ENCOUNTER — Other Ambulatory Visit: Payer: Self-pay | Admitting: Family Medicine

## 2024-04-08 ENCOUNTER — Other Ambulatory Visit: Payer: Self-pay | Admitting: Physical Medicine and Rehabilitation

## 2024-04-08 DIAGNOSIS — E291 Testicular hypofunction: Secondary | ICD-10-CM

## 2024-04-08 DIAGNOSIS — I10 Essential (primary) hypertension: Secondary | ICD-10-CM

## 2024-04-08 DIAGNOSIS — B20 Human immunodeficiency virus [HIV] disease: Secondary | ICD-10-CM

## 2024-04-08 DIAGNOSIS — F32A Depression, unspecified: Secondary | ICD-10-CM

## 2024-05-01 ENCOUNTER — Encounter: Payer: Managed Care, Other (non HMO) | Admitting: Family Medicine

## 2024-05-07 ENCOUNTER — Other Ambulatory Visit: Payer: Self-pay | Admitting: Physical Medicine and Rehabilitation

## 2024-05-07 ENCOUNTER — Other Ambulatory Visit: Payer: Self-pay | Admitting: Family Medicine

## 2024-05-07 DIAGNOSIS — F32A Depression, unspecified: Secondary | ICD-10-CM

## 2024-05-10 NOTE — Telephone Encounter (Signed)
 Called and LVM for patient to call to schedule physical.

## 2024-05-12 ENCOUNTER — Other Ambulatory Visit: Payer: Self-pay | Admitting: Family Medicine

## 2024-05-12 DIAGNOSIS — I10 Essential (primary) hypertension: Secondary | ICD-10-CM

## 2024-05-13 NOTE — Telephone Encounter (Signed)
 Called Patient to reschedule Physical, LVM to give us  a call back.

## 2024-05-20 ENCOUNTER — Other Ambulatory Visit: Payer: Self-pay | Admitting: Family Medicine

## 2024-05-20 DIAGNOSIS — E291 Testicular hypofunction: Secondary | ICD-10-CM

## 2024-06-18 ENCOUNTER — Other Ambulatory Visit: Payer: Self-pay | Admitting: Family Medicine

## 2024-06-18 ENCOUNTER — Other Ambulatory Visit: Payer: Self-pay | Admitting: Physical Medicine and Rehabilitation

## 2024-06-18 DIAGNOSIS — I10 Essential (primary) hypertension: Secondary | ICD-10-CM

## 2024-06-24 ENCOUNTER — Encounter: Payer: Self-pay | Admitting: Radiology

## 2024-06-24 ENCOUNTER — Encounter: Admitting: Family Medicine

## 2024-06-26 ENCOUNTER — Encounter: Payer: Self-pay | Admitting: Family Medicine

## 2024-06-26 ENCOUNTER — Ambulatory Visit: Payer: Self-pay | Admitting: Family Medicine

## 2024-06-26 VITALS — BP 140/90 | HR 78 | Ht 69.0 in | Wt 275.6 lb

## 2024-06-26 DIAGNOSIS — E291 Testicular hypofunction: Secondary | ICD-10-CM

## 2024-06-26 DIAGNOSIS — Z125 Encounter for screening for malignant neoplasm of prostate: Secondary | ICD-10-CM

## 2024-06-26 DIAGNOSIS — Z1211 Encounter for screening for malignant neoplasm of colon: Secondary | ICD-10-CM | POA: Diagnosis not present

## 2024-06-26 DIAGNOSIS — Z0000 Encounter for general adult medical examination without abnormal findings: Secondary | ICD-10-CM | POA: Diagnosis not present

## 2024-06-26 DIAGNOSIS — Z23 Encounter for immunization: Secondary | ICD-10-CM | POA: Diagnosis not present

## 2024-06-26 LAB — POCT GLYCOSYLATED HEMOGLOBIN (HGB A1C): Hemoglobin A1C: 5.6 % (ref 4.0–5.6)

## 2024-06-26 LAB — LIPID PANEL

## 2024-06-26 MED ORDER — LOSARTAN POTASSIUM-HCTZ 100-25 MG PO TABS: 1.0000 | ORAL_TABLET | Freq: Every day | ORAL | 1 refills | Status: AC

## 2024-06-26 MED ORDER — TIRZEPATIDE-WEIGHT MANAGEMENT 2.5 MG/0.5ML ~~LOC~~ SOLN: 2.5000 mg | SUBCUTANEOUS | 0 refills | Status: DC

## 2024-06-26 NOTE — Progress Notes (Signed)
 Name: Patrick Holmes   Date of Visit: 06/26/24   Date of last visit with me: 05/01/2024   CHIEF COMPLAINT:  Chief Complaint  Patient presents with   Annual Exam    Fasting- Wants to talk about which vaccines he wants to do today.       HPI:  Discussed the use of AI scribe software for clinical note transcription with the patient, who gave verbal consent to proceed.  History of Present Illness Patrick Holmes is a 58 year old male who presents for an annual physical exam.  He has no specific questions or concerns at this time. He usually receives the flu and COVID vaccines but has not yet received the flu vaccine this season.  His A1c is currently 5.6, which is slightly elevated from his last measurement, placing him in the prediabetic range. He notes a recent weight increase, which may be contributing to his elevated blood pressure. He reports that his blood pressure is often high when measured in the clinic, but he does not measure it at home and is unsure if it has been normal outside of the clinic setting. He is currently on a low dose of Hyzaar, specifically 100 mg/12.5 mg.  He has been using Cologuard for colon cancer screening and has had one negative result. He does not have a family history of colon cancer. He attempted a colonoscopy previously but experienced significant discomfort from the preparation process.  He is currently on testosterone  therapy and reports that it is still effective for him.  He lives in [place] but receives medications from a pharmacy in [place] due to insurance requirements.     OBJECTIVE:       06/26/2024    9:12 AM  Depression screen PHQ 2/9  Decreased Interest 0  Down, Depressed, Hopeless 1  PHQ - 2 Score 1     BP Readings from Last 3 Encounters:  06/26/24 (!) 140/90  10/24/23 (!) 163/104  06/20/23 124/88    BP (!) 140/90   Pulse 78   Ht 5' 9 (1.753 m)   Wt 275 lb 9.6 oz (125 kg)   SpO2 98%   BMI 40.70 kg/m    Physical  Exam    Physical Exam Constitutional:      Appearance: Normal appearance.  Cardiovascular:     Rate and Rhythm: Normal rate.  Pulmonary:     Effort: Pulmonary effort is normal.  Neurological:     General: No focal deficit present.     Mental Status: He is alert and oriented to person, place, and time. Mental status is at baseline.     ASSESSMENT/PLAN:   Assessment & Plan Routine general medical examination at a health care facility  Screening for prostate cancer  Hypogonadism male  Flu vaccine need  Morbid obesity (HCC)  Encounter for screening colonoscopy    Assessment and Plan Assessment & Plan Adult Wellness Visit Routine adult wellness visit with no acute concerns. - Performed basic labs including A1c, cholesterol, and testosterone  levels. -Comprehensive annual physical exam completed today. Reviewed interval history, current medical issues, medications, allergies, and preventive care needs. Addressed all patient questions and concerns. Discussed lifestyle factors including diet, exercise, sleep, and stress management. Reviewed recommended age-appropriate screenings, labs, and vaccinations. Counseling provided on healthy habits and routine health maintenance. Follow-up as indicated based on findings and results. - Flu and Covid vaccine administered.   Immunization management Due for flu and COVID vaccines. Shingles and pneumococcal vaccines can be delayed  for a year. - Administered flu vaccine. - Administered COVID vaccine. - Delay shingles and pneumococcal vaccines for one year.  Morbid obesity Recent weight gain. Discussed weight loss medications that mimic satiety hormones. Insurance approval needed for medication. - Attempted to obtain insurance approval for weight loss medication. - If approved, schedule follow-up in three weeks to assess tolerance and increase dosage.  Hypertension Elevated blood pressure, possibly due to weight. Currently on low dose  Hyzaar. Discussed increasing dosage for better control. - Increased Hyzaar dosage to 100/125 mg. - Encouraged home blood pressure monitoring and to report readings via MyChart. - Scheduled follow-up appointment to reassess blood pressure.  Prediabetes A1c of 5.6, indicating prediabetes. Recent weight gain noted. - Continue to monitor A1c levels.  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 - Repeated testosterone  levels.  Screening for malignant neoplasm of colon Previous negative Cologuard test. Prefers Cologuard due to adverse experience with colonoscopy prep. Discussed benefits of colonoscopy for more accurate screening. - Ordered Cologuard test. - Plan for colonoscopy in the future, possibly in his 60s.     Patrick Holmes A. Vita MD [facility]

## 2024-06-27 LAB — CBC WITH DIFFERENTIAL/PLATELET
Basophils Absolute: 0 x10E3/uL (ref 0.0–0.2)
Basos: 0 %
EOS (ABSOLUTE): 0 x10E3/uL (ref 0.0–0.4)
Eos: 1 %
Hematocrit: 44.6 % (ref 37.5–51.0)
Hemoglobin: 15.4 g/dL (ref 13.0–17.7)
Immature Grans (Abs): 0 x10E3/uL (ref 0.0–0.1)
Immature Granulocytes: 0 %
Lymphocytes Absolute: 1.9 x10E3/uL (ref 0.7–3.1)
Lymphs: 49 %
MCH: 33.8 pg — ABNORMAL HIGH (ref 26.6–33.0)
MCHC: 34.5 g/dL (ref 31.5–35.7)
MCV: 98 fL — ABNORMAL HIGH (ref 79–97)
Monocytes Absolute: 0.3 x10E3/uL (ref 0.1–0.9)
Monocytes: 7 %
Neutrophils Absolute: 1.7 x10E3/uL (ref 1.4–7.0)
Neutrophils: 43 %
Platelets: 171 x10E3/uL (ref 150–450)
RBC: 4.55 x10E6/uL (ref 4.14–5.80)
RDW: 14.1 % (ref 11.6–15.4)
WBC: 3.9 x10E3/uL (ref 3.4–10.8)

## 2024-06-27 LAB — COMPREHENSIVE METABOLIC PANEL WITH GFR
ALT: 38 IU/L (ref 0–44)
AST: 21 IU/L (ref 0–40)
Albumin: 4.6 g/dL (ref 3.8–4.9)
Alkaline Phosphatase: 55 IU/L (ref 47–123)
BUN/Creatinine Ratio: 14 (ref 9–20)
BUN: 14 mg/dL (ref 6–24)
Bilirubin Total: 1.8 mg/dL — AB (ref 0.0–1.2)
CO2: 23 mmol/L (ref 20–29)
Calcium: 9.7 mg/dL (ref 8.7–10.2)
Chloride: 104 mmol/L (ref 96–106)
Creatinine, Ser: 1.02 mg/dL (ref 0.76–1.27)
Globulin, Total: 2.2 g/dL (ref 1.5–4.5)
Glucose: 129 mg/dL — AB (ref 70–99)
Potassium: 4.3 mmol/L (ref 3.5–5.2)
Sodium: 144 mmol/L (ref 134–144)
Total Protein: 6.8 g/dL (ref 6.0–8.5)
eGFR: 86 mL/min/1.73 (ref >59)

## 2024-06-27 LAB — TESTOSTERONE,FREE AND TOTAL
Testosterone, Free: 2.6 pg/mL — AB (ref 7.2–24.0)
Testosterone: 54 ng/dL — AB (ref 264–916)

## 2024-06-27 LAB — LIPID PANEL
Cholesterol, Total: 154 mg/dL (ref 100–199)
HDL: 51 mg/dL (ref >39)
LDL CALC COMMENT:: 3 ratio (ref 0.0–5.0)
LDL Chol Calc (NIH): 84 mg/dL (ref 0–99)
Triglycerides: 107 mg/dL (ref 0–149)
VLDL Cholesterol Cal: 19 mg/dL (ref 5–40)

## 2024-06-27 LAB — PSA: Prostate Specific Ag, Serum: 0.4 ng/mL (ref 0.0–4.0)

## 2024-06-28 ENCOUNTER — Other Ambulatory Visit: Payer: Self-pay | Admitting: Physical Medicine and Rehabilitation

## 2024-06-28 ENCOUNTER — Other Ambulatory Visit: Payer: Self-pay | Admitting: Family Medicine

## 2024-06-28 ENCOUNTER — Ambulatory Visit: Payer: Self-pay | Admitting: Family Medicine

## 2024-06-28 DIAGNOSIS — I10 Essential (primary) hypertension: Secondary | ICD-10-CM

## 2024-06-28 DIAGNOSIS — F32A Depression, unspecified: Secondary | ICD-10-CM

## 2024-07-01 ENCOUNTER — Telehealth: Payer: Self-pay

## 2024-07-01 NOTE — Telephone Encounter (Signed)
 Copied from CRM #[id number]. Topic: Clinical - Prescription Issue >> Jun 28, 2024 12:08 PM Leonette SQUIBB wrote: Reason for CRM: pt called saying the pharmacy told him the insurance will not pay for the Zepbound.  Please advise...[phone]

## 2024-07-09 ENCOUNTER — Encounter: Payer: Self-pay | Admitting: Family Medicine

## 2024-07-17 ENCOUNTER — Ambulatory Visit: Admitting: Family Medicine

## 2024-07-30 ENCOUNTER — Other Ambulatory Visit: Payer: Self-pay | Admitting: Family Medicine

## 2024-07-30 DIAGNOSIS — B20 Human immunodeficiency virus [HIV] disease: Secondary | ICD-10-CM

## 2024-07-30 DIAGNOSIS — F32A Depression, unspecified: Secondary | ICD-10-CM

## 2024-07-30 NOTE — Telephone Encounter (Signed)
 Last appt 09/06/23

## 2024-08-01 LAB — COLOGUARD: COLOGUARD: NEGATIVE

## 2024-08-07 ENCOUNTER — Other Ambulatory Visit: Payer: Self-pay | Admitting: Family Medicine

## 2024-08-07 DIAGNOSIS — E291 Testicular hypofunction: Secondary | ICD-10-CM

## 2024-08-13 ENCOUNTER — Ambulatory Visit: Admitting: Family Medicine

## 2024-08-20 ENCOUNTER — Telehealth: Payer: Self-pay | Admitting: Internal Medicine

## 2024-08-20 ENCOUNTER — Ambulatory Visit (INDEPENDENT_AMBULATORY_CARE_PROVIDER_SITE_OTHER): Admitting: Family Medicine

## 2024-08-20 ENCOUNTER — Encounter: Payer: Self-pay | Admitting: Family Medicine

## 2024-08-20 VITALS — BP 124/80 | HR 72 | Wt 274.2 lb

## 2024-08-20 DIAGNOSIS — E291 Testicular hypofunction: Secondary | ICD-10-CM

## 2024-08-20 DIAGNOSIS — Z23 Encounter for immunization: Secondary | ICD-10-CM

## 2024-08-20 MED ORDER — TESTOSTERONE CYPIONATE 100 MG/ML IM SOLN: 100.0000 mg | INTRAMUSCULAR | 1 refills | Status: DC

## 2024-08-20 MED ORDER — TESTOSTERONE CYPIONATE 100 MG/ML IM SOLN: 100.0000 mg | INTRAMUSCULAR | 2 refills | Status: DC

## 2024-08-20 NOTE — Progress Notes (Signed)
" ° °  Name: Patrick Holmes   Date of Visit: 08/20/2024   Date of last visit with me: 08/07/2024   CHIEF COMPLAINT:  Chief Complaint  Patient presents with   other    3 week f/u, prescribed GLP ins. Did not cover it.        HPI:  Discussed the use of AI scribe software for clinical note transcription with the patient, who gave verbal consent to proceed.  History of Present Illness   Patrick Holmes is a 58 year old male who presents for management of testosterone  replacement therapy.  His testosterone  levels have been consistently low, with a recent lab result showing a level of 54, which is the lowest recorded. He has been using testosterone  gel, initially at two pumps per day, but increased to three pumps per day. However, he runs out of the gel before the end of the month, leaving him without it for about a week each month. He is concerned about insurance coverage for increasing the dose to six pumps per day.  He is open to switching to testosterone  injections if covered by insurance, as he believes he can administer the shots himself.  His blood pressure has improved and he has no current issues with it.  He has received one shingles vaccine but has not yet received the second dose. He recalls receiving the first dose about a year ago but has not followed up with the second dose due to difficulty in scheduling appointments.         OBJECTIVE:       06/26/2024    9:12 AM  Depression screen PHQ 2/9  Decreased Interest 0  Down, Depressed, Hopeless 1  PHQ - 2 Score 1     BP Readings from Last 3 Encounters:  08/20/24 124/80  06/26/24 (!) 140/90  10/24/23 (!) 163/104    BP 124/80   Pulse 72   Wt 274 lb 3.2 oz (124.4 kg)   BMI 40.49 kg/m    Physical Exam          Physical Exam Constitutional:      Appearance: Normal appearance.  Neurological:     General: No focal deficit present.     Mental Status: He is alert and oriented to person, place, and time. Mental status  is at baseline.     ASSESSMENT/PLAN:   Assessment & Plan Hypogonadism male  Need for shingles vaccine    Assessment and Plan    Male hypogonadism Chronic hypogonadism with low testosterone . Gel treatment insufficient due to poor absorption. Transition to injections expected to improve symptoms. - Previous labs show significantly low testosterone  despite replacement usage.  - Switched to testosterone  injections. - Initiated injections at 100 mg weekly, potential increase to 200 mg based on response. - Scheduled follow-up labs in one month to reassess levels. - Continue gel at four pumps daily until injections approved.  General Health Maintenance Due for second shingles vaccine dose. - Administered second dose of shingles vaccine today.         Patrick Holmes A. Vita MD [facility] "

## 2024-08-20 NOTE — Telephone Encounter (Signed)
 Copied from CRM #[id number]. Topic: Clinical - Prescription Issue >> Aug 20, 2024 11:37 AM Amy B wrote: Reason for CRM: [place] with [facility] called stating that testosterone  cypionate (DEPOTESTOTERONE CYPIONATE) 100 MG/ML injection does not come in a 1 mL injection.  It only comes in a multi dose vial.  Options are to either prescribe 200 mg with a 1/2 mL dose or switch to a 10 mg multi dose vial.  Please send new prescription.  For questions call [phone].

## 2024-08-26 MED ORDER — "BD HYPODERMIC NEEDLE 18G X 1-1/2"" MISC": 2 refills | Status: AC

## 2024-08-26 MED ORDER — "BD INSULIN SYRINGE 25G X 5/8"" 1 ML MISC": 1 refills | Status: AC

## 2024-08-26 MED ORDER — "BD DISP NEEDLES 25G X 5/8"" MISC": 1 refills | Status: AC

## 2024-08-26 NOTE — Addendum Note (Signed)
 Addended by: Antwion Carpenter on: 08/26/2024 02:58 PM   Modules accepted: Orders

## 2024-09-04 ENCOUNTER — Other Ambulatory Visit: Payer: Self-pay | Admitting: Family Medicine

## 2024-09-04 DIAGNOSIS — F32A Depression, unspecified: Secondary | ICD-10-CM

## 2024-09-05 ENCOUNTER — Telehealth: Payer: Self-pay

## 2024-09-05 NOTE — Telephone Encounter (Signed)
 Copied from CRM [id number]. Topic: Clinical - Prescription Issue >> Sep 05, 2024  3:20 PM Darshell M wrote: Reason for CRM: [facility] with [facility]. Refill prescription indicates needles are for intramuscular injection. Per Brooke, 25g, 1 inch needle normally used for intramuscular injections. Provider ordered NEEDLE, DISP, 25 G (BD DISP NEEDLES) 25G X 5/8  Please clarify if this is correct. [facility], [phone]

## 2024-09-06 NOTE — Telephone Encounter (Signed)
 Contacted pharmacy, everything is fixed!

## 2024-09-17 ENCOUNTER — Other Ambulatory Visit

## 2024-09-24 ENCOUNTER — Other Ambulatory Visit

## 2024-09-24 DIAGNOSIS — E291 Testicular hypofunction: Secondary | ICD-10-CM

## 2024-09-25 ENCOUNTER — Ambulatory Visit: Payer: Self-pay | Admitting: Family Medicine

## 2024-09-25 ENCOUNTER — Other Ambulatory Visit: Payer: Self-pay | Admitting: Family Medicine

## 2024-09-25 DIAGNOSIS — E291 Testicular hypofunction: Secondary | ICD-10-CM

## 2024-09-25 MED ORDER — TESTOSTERONE CYPIONATE 200 MG/ML IM SOLN: 100.0000 mg | INTRAMUSCULAR | 5 refills | Status: AC

## 2024-09-25 MED ORDER — TESTOSTERONE 1.62 % TD GEL: TRANSDERMAL | 5 refills | Status: DC

## 2024-09-25 NOTE — Progress Notes (Signed)
 There was confusion on which medicine he was using and he is now using the testosterone  cypionate 100 weekly.

## 2024-09-26 LAB — TESTOSTERONE,FREE AND TOTAL
Testosterone, Free: 8.2 pg/mL (ref 7.2–24.0)
Testosterone: 443 ng/dL (ref 264–916)
